# Patient Record
Sex: Female | Born: 1970
Health system: Southern US, Community
[De-identification: ages and names within clinical notes are randomized; demographics above are authoritative.]

## PROBLEM LIST (undated history)

## (undated) DIAGNOSIS — L309 Dermatitis, unspecified: Secondary | ICD-10-CM

## (undated) HISTORY — DX: Dermatitis, unspecified: L30.9

---

## 1997-10-24 ENCOUNTER — Emergency Department (HOSPITAL_COMMUNITY): Admission: EM | Admit: 1997-10-24 | Discharge: 1997-10-24 | Payer: Self-pay | Admitting: Emergency Medicine

## 1997-10-24 ENCOUNTER — Encounter: Payer: Self-pay | Admitting: Emergency Medicine

## 1998-10-20 ENCOUNTER — Inpatient Hospital Stay (HOSPITAL_COMMUNITY): Admission: AD | Admit: 1998-10-20 | Discharge: 1998-10-20 | Payer: Self-pay | Admitting: Obstetrics and Gynecology

## 1998-10-21 ENCOUNTER — Emergency Department (HOSPITAL_COMMUNITY): Admission: EM | Admit: 1998-10-21 | Discharge: 1998-10-21 | Payer: Self-pay

## 1998-12-17 ENCOUNTER — Inpatient Hospital Stay (HOSPITAL_COMMUNITY): Admission: AD | Admit: 1998-12-17 | Discharge: 1998-12-17 | Payer: Self-pay | Admitting: Obstetrics & Gynecology

## 1998-12-25 ENCOUNTER — Inpatient Hospital Stay (HOSPITAL_COMMUNITY): Admission: AD | Admit: 1998-12-25 | Discharge: 1998-12-28 | Payer: Self-pay | Admitting: Obstetrics and Gynecology

## 1999-01-25 ENCOUNTER — Other Ambulatory Visit: Admission: RE | Admit: 1999-01-25 | Discharge: 1999-01-25 | Payer: Self-pay | Admitting: Obstetrics and Gynecology

## 1999-03-14 ENCOUNTER — Emergency Department (HOSPITAL_COMMUNITY): Admission: EM | Admit: 1999-03-14 | Discharge: 1999-03-14 | Payer: Self-pay | Admitting: Internal Medicine

## 1999-11-03 ENCOUNTER — Emergency Department (HOSPITAL_COMMUNITY): Admission: EM | Admit: 1999-11-03 | Discharge: 1999-11-03 | Payer: Self-pay | Admitting: Emergency Medicine

## 1999-11-08 ENCOUNTER — Encounter: Payer: Self-pay | Admitting: General Practice

## 1999-11-08 ENCOUNTER — Encounter: Admission: RE | Admit: 1999-11-08 | Discharge: 1999-11-08 | Payer: Self-pay | Admitting: General Practice

## 2000-03-15 ENCOUNTER — Other Ambulatory Visit: Admission: RE | Admit: 2000-03-15 | Discharge: 2000-03-15 | Payer: Self-pay | Admitting: Gynecology

## 2001-08-25 ENCOUNTER — Inpatient Hospital Stay (HOSPITAL_COMMUNITY): Admission: AD | Admit: 2001-08-25 | Discharge: 2001-08-27 | Payer: Self-pay | Admitting: Obstetrics and Gynecology

## 2001-10-01 ENCOUNTER — Other Ambulatory Visit: Admission: RE | Admit: 2001-10-01 | Discharge: 2001-10-01 | Payer: Self-pay | Admitting: Obstetrics and Gynecology

## 2002-10-31 ENCOUNTER — Emergency Department (HOSPITAL_COMMUNITY): Admission: EM | Admit: 2002-10-31 | Discharge: 2002-10-31 | Payer: Self-pay | Admitting: Emergency Medicine

## 2002-11-02 ENCOUNTER — Emergency Department (HOSPITAL_COMMUNITY): Admission: EM | Admit: 2002-11-02 | Discharge: 2002-11-02 | Payer: Self-pay | Admitting: Emergency Medicine

## 2002-11-27 ENCOUNTER — Other Ambulatory Visit: Admission: RE | Admit: 2002-11-27 | Discharge: 2002-11-27 | Payer: Self-pay | Admitting: Obstetrics and Gynecology

## 2002-11-30 ENCOUNTER — Other Ambulatory Visit: Admission: RE | Admit: 2002-11-30 | Discharge: 2002-11-30 | Payer: Self-pay | Admitting: Obstetrics and Gynecology

## 2003-02-10 ENCOUNTER — Ambulatory Visit (HOSPITAL_COMMUNITY): Admission: RE | Admit: 2003-02-10 | Discharge: 2003-02-10 | Payer: Self-pay | Admitting: Obstetrics and Gynecology

## 2003-04-01 ENCOUNTER — Inpatient Hospital Stay (HOSPITAL_COMMUNITY): Admission: AD | Admit: 2003-04-01 | Discharge: 2003-04-01 | Payer: Self-pay | Admitting: Obstetrics and Gynecology

## 2003-04-03 ENCOUNTER — Inpatient Hospital Stay (HOSPITAL_COMMUNITY): Admission: AD | Admit: 2003-04-03 | Discharge: 2003-04-03 | Payer: Self-pay | Admitting: Obstetrics & Gynecology

## 2003-04-20 ENCOUNTER — Inpatient Hospital Stay (HOSPITAL_COMMUNITY): Admission: AD | Admit: 2003-04-20 | Discharge: 2003-04-20 | Payer: Self-pay | Admitting: Obstetrics & Gynecology

## 2003-07-03 ENCOUNTER — Inpatient Hospital Stay (HOSPITAL_COMMUNITY): Admission: AD | Admit: 2003-07-03 | Discharge: 2003-07-05 | Payer: Self-pay | Admitting: Obstetrics and Gynecology

## 2004-07-12 ENCOUNTER — Emergency Department (HOSPITAL_COMMUNITY): Admission: EM | Admit: 2004-07-12 | Discharge: 2004-07-12 | Payer: Self-pay | Admitting: Family Medicine

## 2004-11-10 ENCOUNTER — Emergency Department (HOSPITAL_COMMUNITY): Admission: EM | Admit: 2004-11-10 | Discharge: 2004-11-10 | Payer: Self-pay | Admitting: Family Medicine

## 2005-07-20 ENCOUNTER — Emergency Department (HOSPITAL_COMMUNITY): Admission: EM | Admit: 2005-07-20 | Discharge: 2005-07-20 | Payer: Self-pay | Admitting: Family Medicine

## 2006-02-01 ENCOUNTER — Ambulatory Visit: Payer: Self-pay | Admitting: Internal Medicine

## 2006-02-14 ENCOUNTER — Ambulatory Visit: Payer: Self-pay | Admitting: *Deleted

## 2006-03-05 ENCOUNTER — Encounter: Payer: Self-pay | Admitting: Internal Medicine

## 2006-03-05 ENCOUNTER — Ambulatory Visit: Payer: Self-pay | Admitting: Internal Medicine

## 2006-08-10 ENCOUNTER — Emergency Department (HOSPITAL_COMMUNITY): Admission: EM | Admit: 2006-08-10 | Discharge: 2006-08-10 | Payer: Self-pay | Admitting: Emergency Medicine

## 2006-10-18 ENCOUNTER — Emergency Department (HOSPITAL_COMMUNITY): Admission: EM | Admit: 2006-10-18 | Discharge: 2006-10-18 | Payer: Self-pay | Admitting: Family Medicine

## 2006-11-06 ENCOUNTER — Encounter (INDEPENDENT_AMBULATORY_CARE_PROVIDER_SITE_OTHER): Payer: Self-pay | Admitting: *Deleted

## 2006-12-19 ENCOUNTER — Ambulatory Visit: Payer: Self-pay | Admitting: Internal Medicine

## 2006-12-19 LAB — CONVERTED CEMR LAB
AST: 14 units/L (ref 0–37)
Albumin: 4.3 g/dL (ref 3.5–5.2)
Alkaline Phosphatase: 34 units/L — ABNORMAL LOW (ref 39–117)
Basophils Relative: 1 % (ref 0–1)
Eosinophils Absolute: 0.1 10*3/uL (ref 0.0–0.7)
MCHC: 32.8 g/dL (ref 30.0–36.0)
MCV: 81.8 fL (ref 78.0–100.0)
Neutrophils Relative %: 65 % (ref 43–77)
Platelets: 230 10*3/uL (ref 150–400)
Potassium: 4.1 meq/L (ref 3.5–5.3)
RDW: 14.4 % — ABNORMAL HIGH (ref 11.5–14.0)
Sodium: 139 meq/L (ref 135–145)
Total Protein: 7.7 g/dL (ref 6.0–8.3)

## 2007-03-07 ENCOUNTER — Ambulatory Visit: Payer: Self-pay | Admitting: Internal Medicine

## 2007-03-07 LAB — CONVERTED CEMR LAB

## 2007-07-08 ENCOUNTER — Ambulatory Visit: Payer: Self-pay | Admitting: Internal Medicine

## 2007-10-09 ENCOUNTER — Ambulatory Visit: Payer: Self-pay | Admitting: Internal Medicine

## 2007-12-09 ENCOUNTER — Ambulatory Visit: Payer: Self-pay | Admitting: Family Medicine

## 2008-03-10 ENCOUNTER — Emergency Department (HOSPITAL_COMMUNITY): Admission: EM | Admit: 2008-03-10 | Discharge: 2008-03-10 | Payer: Self-pay | Admitting: Emergency Medicine

## 2008-04-01 ENCOUNTER — Encounter: Payer: Self-pay | Admitting: Internal Medicine

## 2008-04-01 ENCOUNTER — Ambulatory Visit: Payer: Self-pay | Admitting: Internal Medicine

## 2008-04-21 ENCOUNTER — Emergency Department (HOSPITAL_COMMUNITY): Admission: EM | Admit: 2008-04-21 | Discharge: 2008-04-21 | Payer: Self-pay | Admitting: Emergency Medicine

## 2008-09-06 ENCOUNTER — Ambulatory Visit: Payer: Self-pay | Admitting: Family Medicine

## 2008-10-21 ENCOUNTER — Ambulatory Visit: Payer: Self-pay | Admitting: Internal Medicine

## 2009-12-01 ENCOUNTER — Encounter (INDEPENDENT_AMBULATORY_CARE_PROVIDER_SITE_OTHER): Payer: Self-pay | Admitting: *Deleted

## 2009-12-01 LAB — CONVERTED CEMR LAB
ALT: <8 units/L (ref 0–35)
Albumin: 4.1 g/dL (ref 3.5–5.2)
Basophils Absolute: 0.1 10*3/uL (ref 0.0–0.1)
CO2: 26 meq/L (ref 19–32)
Calcium: 9.2 mg/dL (ref 8.4–10.5)
Chlamydia, DNA Probe: NEGATIVE
Chloride: 102 meq/L (ref 96–112)
Creatinine, Ser: 0.75 mg/dL (ref 0.40–1.20)
GC Probe Amp, Genital: NEGATIVE
Hemoglobin: 11.9 g/dL — ABNORMAL LOW (ref 12.0–15.0)
Lymphocytes Relative: 31 % (ref 12–46)
Monocytes Absolute: 0.6 10*3/uL (ref 0.1–1.0)
Neutro Abs: 4.5 10*3/uL (ref 1.7–7.7)
Neutrophils Relative %: 55 % (ref 43–77)
Potassium: 4.4 meq/L (ref 3.5–5.3)
RDW: 14.5 % (ref 11.5–15.5)
Sodium: 138 meq/L (ref 135–145)
TSH: 0.945 microintl units/mL (ref 0.350–4.500)
Total Protein: 7.7 g/dL (ref 6.0–8.3)

## 2010-06-01 LAB — URINALYSIS, ROUTINE W REFLEX MICROSCOPIC
Bilirubin Urine: NEGATIVE
Hgb urine dipstick: NEGATIVE
Ketones, ur: NEGATIVE mg/dL
Nitrite: NEGATIVE
Protein, ur: NEGATIVE mg/dL
Urobilinogen, UA: 0.2 mg/dL (ref 0.0–1.0)

## 2010-06-05 LAB — DIFFERENTIAL
Lymphocytes Relative: 18 % (ref 12–46)
Lymphs Abs: 1.1 10*3/uL (ref 0.7–4.0)
Monocytes Absolute: 0.4 10*3/uL (ref 0.1–1.0)
Monocytes Relative: 6 % (ref 3–12)
Neutro Abs: 4.7 10*3/uL (ref 1.7–7.7)
Neutrophils Relative %: 74 % (ref 43–77)

## 2010-06-05 LAB — CBC
HCT: 38.4 % (ref 36.0–46.0)
Hemoglobin: 12.6 g/dL (ref 12.0–15.0)
MCHC: 32.9 g/dL (ref 30.0–36.0)
Platelets: 188 10*3/uL (ref 150–400)
RDW: 13.7 % (ref 11.5–15.5)

## 2010-06-05 LAB — COMPREHENSIVE METABOLIC PANEL
Albumin: 3.8 g/dL (ref 3.5–5.2)
Alkaline Phosphatase: 37 U/L — ABNORMAL LOW (ref 39–117)
BUN: 5 mg/dL — ABNORMAL LOW (ref 6–23)
Calcium: 9.3 mg/dL (ref 8.4–10.5)
Glucose, Bld: 99 mg/dL (ref 70–99)
Potassium: 4.2 mEq/L (ref 3.5–5.1)
Sodium: 140 mEq/L (ref 135–145)
Total Protein: 7.3 g/dL (ref 6.0–8.3)

## 2010-06-05 LAB — URINALYSIS, ROUTINE W REFLEX MICROSCOPIC
Bilirubin Urine: NEGATIVE
Glucose, UA: NEGATIVE mg/dL
Protein, ur: NEGATIVE mg/dL
Urobilinogen, UA: 0.2 mg/dL (ref 0.0–1.0)

## 2010-06-05 LAB — URINE CULTURE: Colony Count: NO GROWTH

## 2010-06-05 LAB — BRAIN NATRIURETIC PEPTIDE: Pro B Natriuretic peptide (BNP): <30 pg/mL (ref 0.0–100.0)

## 2010-07-04 ENCOUNTER — Emergency Department (HOSPITAL_COMMUNITY)
Admission: EM | Admit: 2010-07-04 | Discharge: 2010-07-04 | Disposition: A | Discharge status: Home / Self Care | Payer: No Typology Code available for payment source | Attending: Emergency Medicine | Admitting: Emergency Medicine

## 2010-07-04 DIAGNOSIS — T148XXA Other injury of unspecified body region, initial encounter: Secondary | ICD-10-CM | POA: Insufficient documentation

## 2010-07-04 DIAGNOSIS — Y92410 Unspecified street and highway as the place of occurrence of the external cause: Secondary | ICD-10-CM | POA: Insufficient documentation

## 2010-07-04 DIAGNOSIS — Z79899 Other long term (current) drug therapy: Secondary | ICD-10-CM | POA: Insufficient documentation

## 2010-07-04 DIAGNOSIS — M542 Cervicalgia: Secondary | ICD-10-CM | POA: Insufficient documentation

## 2010-07-04 DIAGNOSIS — Y998 Other external cause status: Secondary | ICD-10-CM | POA: Insufficient documentation

## 2010-07-04 DIAGNOSIS — M25519 Pain in unspecified shoulder: Secondary | ICD-10-CM | POA: Insufficient documentation

## 2010-07-04 DIAGNOSIS — R079 Chest pain, unspecified: Secondary | ICD-10-CM | POA: Insufficient documentation

## 2010-11-30 ENCOUNTER — Other Ambulatory Visit: Payer: Self-pay | Admitting: Family Medicine

## 2010-11-30 DIAGNOSIS — M545 Low back pain, unspecified: Secondary | ICD-10-CM

## 2010-12-05 ENCOUNTER — Ambulatory Visit
Admission: RE | Admit: 2010-12-05 | Discharge: 2010-12-05 | Disposition: A | Discharge status: Home / Self Care | Payer: No Typology Code available for payment source | Source: Ambulatory Visit | Attending: Family Medicine | Admitting: Family Medicine

## 2010-12-05 DIAGNOSIS — M545 Low back pain, unspecified: Secondary | ICD-10-CM

## 2010-12-06 LAB — POCT RAPID STREP A: Streptococcus, Group A Screen (Direct): NEGATIVE

## 2010-12-14 ENCOUNTER — Ambulatory Visit
Admission: RE | Admit: 2010-12-14 | Discharge: 2010-12-14 | Disposition: A | Discharge status: Home / Self Care | Payer: No Typology Code available for payment source | Source: Ambulatory Visit | Attending: Family Medicine | Admitting: Family Medicine

## 2010-12-28 ENCOUNTER — Ambulatory Visit: Payer: No Typology Code available for payment source | Attending: Family Medicine | Admitting: Physical Therapy

## 2010-12-28 DIAGNOSIS — M545 Low back pain, unspecified: Secondary | ICD-10-CM | POA: Insufficient documentation

## 2010-12-28 DIAGNOSIS — IMO0001 Reserved for inherently not codable concepts without codable children: Secondary | ICD-10-CM | POA: Insufficient documentation

## 2011-01-09 ENCOUNTER — Ambulatory Visit: Payer: No Typology Code available for payment source | Admitting: Physical Therapy

## 2011-01-15 ENCOUNTER — Ambulatory Visit: Payer: No Typology Code available for payment source | Admitting: Rehabilitation

## 2011-01-17 ENCOUNTER — Ambulatory Visit: Payer: No Typology Code available for payment source | Admitting: Rehabilitation

## 2011-01-22 ENCOUNTER — Ambulatory Visit: Payer: No Typology Code available for payment source | Attending: Family Medicine | Admitting: Rehabilitation

## 2011-01-22 DIAGNOSIS — IMO0001 Reserved for inherently not codable concepts without codable children: Secondary | ICD-10-CM | POA: Insufficient documentation

## 2011-01-22 DIAGNOSIS — M545 Low back pain, unspecified: Secondary | ICD-10-CM | POA: Insufficient documentation

## 2011-01-24 ENCOUNTER — Ambulatory Visit: Payer: No Typology Code available for payment source | Admitting: Rehabilitation

## 2011-01-31 ENCOUNTER — Encounter: Payer: No Typology Code available for payment source | Admitting: Physical Therapy

## 2011-02-02 ENCOUNTER — Encounter: Payer: No Typology Code available for payment source | Admitting: Physical Therapy

## 2011-09-03 ENCOUNTER — Encounter: Payer: Self-pay | Admitting: Physical Medicine & Rehabilitation

## 2011-10-16 ENCOUNTER — Ambulatory Visit: Payer: Self-pay | Admitting: Physical Medicine & Rehabilitation

## 2011-10-16 ENCOUNTER — Encounter: Payer: Self-pay | Attending: Physical Medicine & Rehabilitation

## 2014-04-13 ENCOUNTER — Emergency Department (HOSPITAL_COMMUNITY)
Admission: EM | Admit: 2014-04-13 | Discharge: 2014-04-13 | Disposition: A | Discharge status: Home / Self Care | Payer: BC Managed Care – PPO | Attending: Emergency Medicine | Admitting: Emergency Medicine

## 2014-04-13 ENCOUNTER — Encounter (HOSPITAL_COMMUNITY): Payer: Self-pay

## 2014-04-13 ENCOUNTER — Emergency Department (HOSPITAL_COMMUNITY): Payer: BC Managed Care – PPO

## 2014-04-13 DIAGNOSIS — S6992XA Unspecified injury of left wrist, hand and finger(s), initial encounter: Secondary | ICD-10-CM | POA: Diagnosis not present

## 2014-04-13 DIAGNOSIS — M25512 Pain in left shoulder: Secondary | ICD-10-CM

## 2014-04-13 DIAGNOSIS — Y9301 Activity, walking, marching and hiking: Secondary | ICD-10-CM | POA: Insufficient documentation

## 2014-04-13 DIAGNOSIS — S199XXA Unspecified injury of neck, initial encounter: Secondary | ICD-10-CM | POA: Insufficient documentation

## 2014-04-13 DIAGNOSIS — R202 Paresthesia of skin: Secondary | ICD-10-CM

## 2014-04-13 DIAGNOSIS — S4992XA Unspecified injury of left shoulder and upper arm, initial encounter: Secondary | ICD-10-CM | POA: Insufficient documentation

## 2014-04-13 DIAGNOSIS — Y92481 Parking lot as the place of occurrence of the external cause: Secondary | ICD-10-CM | POA: Diagnosis not present

## 2014-04-13 DIAGNOSIS — M542 Cervicalgia: Secondary | ICD-10-CM

## 2014-04-13 DIAGNOSIS — M25532 Pain in left wrist: Secondary | ICD-10-CM

## 2014-04-13 DIAGNOSIS — Y998 Other external cause status: Secondary | ICD-10-CM | POA: Insufficient documentation

## 2014-04-13 MED ORDER — NAPROXEN 500 MG PO TABS: 500.0000 mg | ORAL_TABLET | Freq: Two times a day (BID) | ORAL | Status: DC
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
## 2014-04-13 NOTE — Discharge Instructions (Signed)
Paresthesia Paresthesia is an abnormal burning or prickling sensation. This sensation is generally felt in the hands, arms, legs, or feet. However, it may occur in any part of the body. It is usually not painful. The feeling may be described as:  Tingling or numbness.  "Pins and needles."  Skin crawling.  Buzzing.  Limbs "falling asleep."  Itching. Most people experience temporary (transient) paresthesia at some time in their lives. CAUSES  Paresthesia may occur when you breathe too quickly (hyperventilation). It can also occur without any apparent cause. Commonly, paresthesia occurs when pressure is placed on a nerve. The feeling quickly goes away once the pressure is removed. For some people, however, paresthesia is a long-lasting (chronic) condition caused by an underlying disorder. The underlying disorder may be:  A traumatic, direct injury to nerves. Examples include a:  Broken (fractured) neck.  Fractured skull.  A disorder affecting the brain and spinal cord (central nervous system). Examples include:  Transverse myelitis.  Encephalitis.  Transient ischemic attack.  Multiple sclerosis.  Stroke.  Tumor or blood vessel problems, such as an arteriovenous malformation pressing against the brain or spinal cord.  A condition that damages the peripheral nerves (peripheral neuropathy). Peripheral nerves are not part of the brain and spinal cord. These conditions include:  Diabetes.  Peripheral vascular disease.  Nerve entrapment syndromes, such as carpal tunnel syndrome.  Shingles.  Hypothyroidism.  Vitamin B12 deficiencies.  Alcoholism.  Heavy metal poisoning (lead, arsenic).  Rheumatoid arthritis.  Systemic lupus erythematosus. DIAGNOSIS  Your caregiver will attempt to find the underlying cause of your paresthesia. Your caregiver may:  Take your medical history.  Perform a physical exam.  Order various lab tests.  Order imaging tests. TREATMENT    Treatment for paresthesia depends on the underlying cause. HOME CARE INSTRUCTIONS  Avoid drinking alcohol.  You may consider massage or acupuncture to help relieve your symptoms.  Keep all follow-up appointments as directed by your caregiver. SEEK IMMEDIATE MEDICAL CARE IF:   You feel weak.  You have trouble walking or moving.  You have problems with speech or vision.  You feel confused.  You cannot control your bladder or bowel movements.  You feel numbness after an injury.  You faint.  Your burning or prickling feeling gets worse when walking.  You have pain, cramps, or dizziness.  You develop a rash. MAKE SURE YOU:  Understand these instructions.  Will watch your condition.  Will get help right away if you are not doing well or get worse. Document Released: 01/26/2002 Document Revised: 04/30/2011 Document Reviewed: 10/27/2010 [facility] Patient Information 2015 [place], [place]. This information is not intended to replace advice given to you by your health care provider. Make sure you discuss any questions you have with your health care provider.  Shoulder Pain The shoulder is the joint that connects your arms to your body. The bones that form the shoulder joint include the upper arm bone (humerus), the shoulder blade (scapula), and the collarbone (clavicle). The top of the humerus is shaped like a ball and fits into a rather flat socket on the scapula (glenoid cavity). A combination of muscles and strong, fibrous tissues that connect muscles to bones (tendons) support your shoulder joint and hold the ball in the socket. Small, fluid-filled sacs (bursae) are located in different areas of the joint. They act as cushions between the bones and the overlying soft tissues and help reduce friction between the gliding tendons and the bone as you move your arm. Your shoulder  joint allows a wide range of motion in your arm. This range of motion allows you to do things like scratch  your back or throw a ball. However, this range of motion also makes your shoulder more prone to pain from overuse and injury. Causes of shoulder pain can originate from both injury and overuse and usually can be grouped in the following four categories:  Redness, swelling, and pain (inflammation) of the tendon (tendinitis) or the bursae (bursitis).  Instability, such as a dislocation of the joint.  Inflammation of the joint (arthritis).  Broken bone (fracture). HOME CARE INSTRUCTIONS   Apply ice to the sore area.  Put ice in a plastic bag.  Place a towel between your skin and the bag.  Leave the ice on for 15-20 minutes, 3-4 times per day for the first 2 days, or as directed by your health care provider.  Stop using cold packs if they do not help with the pain.  If you have a shoulder sling or immobilizer, wear it as long as your caregiver instructs. Only remove it to shower or bathe. Move your arm as little as possible, but keep your hand moving to prevent swelling.  Squeeze a soft ball or foam pad as much as possible to help prevent swelling.  Only take over-the-counter or prescription medicines for pain, discomfort, or fever as directed by your caregiver. SEEK MEDICAL CARE IF:   Your shoulder pain increases, or new pain develops in your arm, hand, or fingers.  Your hand or fingers become cold and numb.  Your pain is not relieved with medicines. SEEK IMMEDIATE MEDICAL CARE IF:   Your arm, hand, or fingers are numb or tingling.  Your arm, hand, or fingers are significantly swollen or turn white or blue. MAKE SURE YOU:   Understand these instructions.  Will watch your condition.  Will get help right away if you are not doing well or get worse. Document Released: 11/15/2004 Document Revised: 06/22/2013 Document Reviewed: 01/20/2011 [facility] Patient Information 2015 [place], [place]. This information is not intended to replace advice given to you by your health care  provider. Make sure you discuss any questions you have with your health care provider.   Wrist Pain Wrist injuries are frequent in adults and children. A sprain is an injury to the ligaments that hold your bones together. A strain is an injury to muscle or muscle cord-like structures (tendons) from stretching or pulling. Generally, when wrists are moderately tender to touch following a fall or injury, a break in the bone (fracture) may be present. Most wrist sprains or strains are better in 3 to 5 days, but complete healing may take several weeks. HOME CARE INSTRUCTIONS   Put ice on the injured area.  Put ice in a plastic bag.  Place a towel between your skin and the bag.  Leave the ice on for 15-20 minutes, 3-4 times a day, for the first 2 days, or as directed by your health care provider.  Keep your arm raised above the level of your heart whenever possible to reduce swelling and pain.  Rest the injured area for at least 48 hours or as directed by your health care provider.  If a splint or elastic bandage has been applied, use it for as long as directed by your health care provider or until seen by a health care provider for a follow-up exam.  Only take over-the-counter or prescription medicines for pain, discomfort, or fever as directed by your health care  provider.  Keep all follow-up appointments. You may need to follow up with a specialist or have follow-up X-rays. Improvement in pain level is not a guarantee that you did not fracture a bone in your wrist. The only way to determine whether or not you have a broken bone is by X-ray. SEEK IMMEDIATE MEDICAL CARE IF:   Your fingers are swollen, very red, white, or cold and blue.  Your fingers are numb or tingling.  You have increasing pain.  You have difficulty moving your fingers. MAKE SURE YOU:   Understand these instructions.  Will watch your condition.  Will get help right away if you are not doing well or get  worse. Document Released: 11/15/2004 Document Revised: 02/10/2013 Document Reviewed: 03/29/2010 [facility] Patient Information 2015 [place], [place]. This information is not intended to replace advice given to you by your health care provider. Make sure you discuss any questions you have with your health care provider.

## 2014-04-13 NOTE — ED Notes (Signed)
Patient states she woke this AM with left shoulder pain. Patient states pain is radiating down to her left wrist and up into her left neck.

## 2014-06-19 ENCOUNTER — Emergency Department (HOSPITAL_COMMUNITY)
Admission: EM | Admit: 2014-06-19 | Discharge: 2014-06-19 | Disposition: A | Discharge status: Home / Self Care | Payer: BC Managed Care – PPO | Source: Home / Self Care | Attending: Family Medicine | Admitting: Family Medicine

## 2014-06-19 ENCOUNTER — Encounter (HOSPITAL_COMMUNITY): Payer: Self-pay | Admitting: Emergency Medicine

## 2014-06-19 DIAGNOSIS — S91331A Puncture wound without foreign body, right foot, initial encounter: Secondary | ICD-10-CM

## 2014-06-19 DIAGNOSIS — L03115 Cellulitis of right lower limb: Secondary | ICD-10-CM

## 2014-06-19 DIAGNOSIS — Z23 Encounter for immunization: Secondary | ICD-10-CM

## 2014-06-19 MED ORDER — INDOMETHACIN 25 MG PO CAPS: 25.0000 mg | ORAL_CAPSULE | Freq: Three times a day (TID) | ORAL | Status: DC | PRN

## 2014-06-19 MED ORDER — TETANUS-DIPHTH-ACELL PERTUSSIS 5-2.5-18.5 LF-MCG/0.5 IM SUSP
0.5000 mL | Freq: Once | INTRAMUSCULAR | Status: AC
  Administered 2014-06-19: 0.5 mL via INTRAMUSCULAR

## 2014-06-19 MED ORDER — CEPHALEXIN 500 MG PO CAPS: 500.0000 mg | ORAL_CAPSULE | Freq: Four times a day (QID) | ORAL | Status: DC

## 2014-06-19 MED ORDER — TETANUS-DIPHTH-ACELL PERTUSSIS 5-2.5-18.5 LF-MCG/0.5 IM SUSP
INTRAMUSCULAR | Status: AC
  Filled 2014-06-19: qty 0.5

## 2014-06-19 NOTE — ED Notes (Addendum)
C/o right foot pain onset 1400 today when she woke up after a nap Sx include swelling, pain, and localized fever Denies inj/trauma Pain increases w/activity Alert, no signs of acute distress.

## 2014-06-19 NOTE — Discharge Instructions (Signed)
Elevate. Warm compresses. Medications as directed. Follow up with your primary care doctor in 2 days for recheck.  Cellulitis Cellulitis is an infection of the skin and the tissue beneath it. The infected area is usually red and tender. Cellulitis occurs most often in the arms and lower legs.  CAUSES  Cellulitis is caused by bacteria that enter the skin through cracks or cuts in the skin. The most common types of bacteria that cause cellulitis are staphylococci and streptococci. SIGNS AND SYMPTOMS   Redness and warmth.  Swelling.  Tenderness or pain.  Fever. DIAGNOSIS  Your health care provider can usually determine what is wrong based on a physical exam. Blood tests may also be done. TREATMENT  Treatment usually involves taking an antibiotic medicine. HOME CARE INSTRUCTIONS   Take your antibiotic medicine as directed by your health care provider. Finish the antibiotic even if you start to feel better.  Keep the infected arm or leg elevated to reduce swelling.  Apply a warm cloth to the affected area up to 4 times per day to relieve pain.  Take medicines only as directed by your health care provider.  Keep all follow-up visits as directed by your health care provider. SEEK MEDICAL CARE IF:   You notice red streaks coming from the infected area.  Your red area gets larger or turns dark in color.  Your bone or joint underneath the infected area becomes painful after the skin has healed.  Your infection returns in the same area or another area.  You notice a swollen bump in the infected area.  You develop new symptoms.  You have a fever. SEEK IMMEDIATE MEDICAL CARE IF:   You feel very sleepy.  You develop vomiting or diarrhea.  You have a general ill feeling (malaise) with muscle aches and pains. MAKE SURE YOU:   Understand these instructions.  Will watch your condition.  Will get help right away if you are not doing well or get worse. Document Released:  11/15/2004 Document Revised: 06/22/2013 Document Reviewed: 04/23/2011 [facility] Patient Information 2015 [place], [place]. This information is not intended to replace advice given to you by your health care provider. Make sure you discuss any questions you have with your health care provider.  Puncture Wound A puncture wound is an injury that extends through all layers of the skin and into the tissue beneath the skin (subcutaneous tissue). Puncture wounds become infected easily because germs often enter the body and go beneath the skin during the injury. Having a deep wound with a small entrance point makes it difficult for your caregiver to adequately clean the wound. This is especially true if you have stepped on a nail and it has passed through a dirty shoe or other situations where the wound is obviously contaminated. CAUSES  Many puncture wounds involve glass, nails, splinters, fish hooks, or other objects that enter the skin (foreign bodies). A puncture wound may also be caused by a human bite or animal bite. DIAGNOSIS  A puncture wound is usually diagnosed by your history and a physical exam. You may need to have an X-ray or an ultrasound to check for any foreign bodies still in the wound. TREATMENT   Your caregiver will clean the wound as thoroughly as possible. Depending on the location of the wound, a bandage (dressing) may be applied.  Your caregiver might prescribe antibiotic medicines.  You may need a follow-up visit to check on your wound. Follow all instructions as directed by your caregiver. HOME  CARE INSTRUCTIONS   Change your dressing once per day, or as directed by your caregiver. If the dressing sticks, it may be removed by soaking the area in water.  If your caregiver has given you follow-up instructions, it is very important that you return for a follow-up appointment. Not following up as directed could result in a chronic or permanent injury, pain, and disability.  Only  take over-the-counter or prescription medicines for pain, discomfort, or fever as directed by your caregiver.  If you are given antibiotics, take them as directed. Finish them even if you start to feel better. You may need a tetanus shot if:  You cannot remember when you had your last tetanus shot.  You have never had a tetanus shot. If you got a tetanus shot, your arm may swell, get red, and feel warm to the touch. This is common and not a problem. If you need a tetanus shot and you choose not to have one, there is a rare chance of getting tetanus. Sickness from tetanus can be serious. You may need a rabies shot if an animal bite caused your puncture wound. SEEK MEDICAL CARE IF:   You have redness, swelling, or increasing pain in the wound.  You have red streaks going away from the wound.  You notice a bad smell coming from the wound or dressing.  You have yellowish-white fluid (pus) coming from the wound.  You are treated with an antibiotic for infection, but the infection is not getting better.  You notice something in the wound, such as rubber from your shoe, cloth, or another object.  You have a fever.  You have severe pain.  You have difficulty breathing.  You feel dizzy or faint.  You cannot stop vomiting.  You lose feeling, develop numbness, or cannot move a limb below the wound.  Your symptoms worsen. MAKE SURE YOU:  Understand these instructions.  Will watch your condition.  Will get help right away if you are not doing well or get worse. Document Released: 11/15/2004 Document Revised: 04/30/2011 Document Reviewed: 07/25/2010 [facility] Patient Information 2015 [place], [place]. This information is not intended to replace advice given to you by your health care provider. Make sure you discuss any questions you have with your health care provider.

## 2014-06-19 NOTE — ED Provider Notes (Signed)
CSN: [id number]     Arrival date & time 06/19/14  1758 History   First MD Initiated Contact with Patient 06/19/14 1907     Chief Complaint  Patient presents with  . Foot Pain   (Consider location/radiation/quality/duration/timing/severity/associated sxs/prior Treatment) HPI Comments: Patient states she took a nap this afternoon and then woke around 2pm with a very painful right foot in the region of her right great toe. Does not recall recent injury. No previous episodes. No hx of gout. Reports herself to be otherwise healthy. Non-smoker. No ETOH. Works as Engineer, siteschool teacher.  The history is provided by the patient.    History reviewed. No pertinent past medical history. Past Surgical History  Procedure Laterality Date  . Cesarean section     Family History  Problem Relation Age of Onset  . Cancer Mother   . Cancer Father   . Hypertension Sister   . Hypertension Brother    History  Substance Use Topics  . Smoking status: Never Smoker   . Smokeless tobacco: Not on file  . Alcohol Use: No   OB History    No data available     Review of Systems  All other systems reviewed and are negative.   Allergies  Review of patient's allergies indicates no known allergies.  Home Medications   Prior to Admission medications   Medication Sig Start Date End Date Taking? Authorizing Provider  cephALEXin (KEFLEX) 500 MG capsule Take 1 capsule (500 mg total) by mouth 4 (four) times daily. X 7 days 06/19/14   Ria ClockJennifer Lee H Lachlan Pelto, PA  indomethacin (INDOCIN) 25 MG capsule Take 1 capsule (25 mg total) by mouth 3 (three) times daily as needed for mild pain or moderate pain. 06/19/14   Mathis FareJennifer Lee H Damaya Channing, PA  naproxen (NAPROSYN) 500 MG tablet Take 1 tablet (500 mg total) by mouth 2 (two) times daily. 04/13/14   Abigail Harris, PA-C   BP 128/70 mmHg  Pulse 63  Temp(Src) 97.4 F (36.3 C) (Oral)  Resp 16  SpO2 100% Physical Exam  Constitutional: She is oriented to person, place, and time.  She appears well-developed and well-nourished.  HENT:  Head: Normocephalic and atraumatic.  Eyes: Conjunctivae are normal.  Cardiovascular: Normal rate.   Pulmonary/Chest: Effort normal.  Musculoskeletal: Normal range of motion.       Feet:  Outlined area is region of moderate pain with palpation and ROM. Mild STS and erythema without induration or fluctuance. When plantar surface of foot examined, I identified a small puncture wound at base of right great toe. When I questioned patient regarding this, she then recalled having stepped on a toothpick that was embedded in the carpet at her home while wearing socks one day prior to DOS. No palpable or visible FB.   Neurological: She is alert and oriented to person, place, and time.  Skin: Skin is warm and dry.  Psychiatric: She has a normal mood and affect. Her behavior is normal.  Nursing note and vitals reviewed.   ED Course  Procedures (including critical care time) Labs Review Labs Reviewed - No data to display  Imaging Review No results found.   MDM   1. Puncture wound of right foot, initial encounter   2. Cellulitis of right foot    Patient received Tdap booster on DOS given new puncture wound and tetanus status unknown.  Will treat as cellulitis from recent puncture wound. Cephalexin, Indocin, warm compresses and elevation. Strongly encouraged to follow up with her PCP  in 48 hours for re-check.    Ria Clock, [place] 06/20/14 [phone]

## 2014-08-27 ENCOUNTER — Encounter (HOSPITAL_COMMUNITY): Payer: Self-pay | Admitting: *Deleted

## 2014-08-27 ENCOUNTER — Emergency Department (HOSPITAL_COMMUNITY)
Admission: EM | Admit: 2014-08-27 | Discharge: 2014-08-27 | Disposition: A | Discharge status: Home / Self Care | Payer: BC Managed Care – PPO | Attending: Emergency Medicine | Admitting: Emergency Medicine

## 2014-08-27 DIAGNOSIS — Y998 Other external cause status: Secondary | ICD-10-CM | POA: Insufficient documentation

## 2014-08-27 DIAGNOSIS — Y9389 Activity, other specified: Secondary | ICD-10-CM | POA: Insufficient documentation

## 2014-08-27 DIAGNOSIS — T7840XA Allergy, unspecified, initial encounter: Secondary | ICD-10-CM | POA: Diagnosis present

## 2014-08-27 DIAGNOSIS — Y9289 Other specified places as the place of occurrence of the external cause: Secondary | ICD-10-CM | POA: Diagnosis not present

## 2014-08-27 DIAGNOSIS — Z791 Long term (current) use of non-steroidal anti-inflammatories (NSAID): Secondary | ICD-10-CM | POA: Diagnosis not present

## 2014-08-27 DIAGNOSIS — R1310 Dysphagia, unspecified: Secondary | ICD-10-CM | POA: Diagnosis not present

## 2014-08-27 DIAGNOSIS — X58XXXA Exposure to other specified factors, initial encounter: Secondary | ICD-10-CM | POA: Insufficient documentation

## 2014-08-27 DIAGNOSIS — T781XXA Other adverse food reactions, not elsewhere classified, initial encounter: Secondary | ICD-10-CM | POA: Insufficient documentation

## 2014-08-27 MED ORDER — DIPHENHYDRAMINE HCL 50 MG/ML IJ SOLN
25.0000 mg | Freq: Once | INTRAMUSCULAR | Status: AC
  Administered 2014-08-27: 25 mg via INTRAVENOUS
  Filled 2014-08-27: qty 1

## 2014-08-27 MED ORDER — PREDNISONE 20 MG PO TABS: 40.0000 mg | ORAL_TABLET | Freq: Every day | ORAL | Status: DC

## 2014-08-27 MED ORDER — FAMOTIDINE 20 MG PO TABS
20.0000 mg | ORAL_TABLET | Freq: Once | ORAL | Status: AC
  Administered 2014-08-27: 20 mg via ORAL
  Filled 2014-08-27: qty 1

## 2014-08-27 MED ORDER — METHYLPREDNISOLONE SODIUM SUCC 125 MG IJ SOLR
125.0000 mg | Freq: Once | INTRAMUSCULAR | Status: AC
  Administered 2014-08-27: 125 mg via INTRAVENOUS
  Filled 2014-08-27: qty 2

## 2014-08-27 MED ORDER — EPINEPHRINE 0.3 MG/0.3ML IJ SOAJ: 0.3000 mg | Freq: Once | INTRAMUSCULAR | Status: DC

## 2014-08-27 NOTE — Discharge Instructions (Signed)

## 2014-08-27 NOTE — ED Provider Notes (Addendum)
CSN: [id number]     Arrival date & time 08/27/14  1250 History   First MD Initiated Contact with Patient 08/27/14 1257     Chief Complaint  Patient presents with  . Allergic Reaction     (Consider location/radiation/quality/duration/timing/severity/associated sxs/prior Treatment) Patient is a 44 y.o. female presenting with allergic reaction. The history is provided by the patient.  Allergic Reaction Presenting symptoms: difficulty swallowing   Presenting symptoms: no difficulty breathing   Severity:  Moderate Prior allergic episodes:  No prior episodes Context comment:  After eating cherries today pt felt like her throat was closing up.  this started around 9:30 and has gradually worsened Relieved by:  None tried Exacerbated by: lying down. Ineffective treatments:  None tried   History reviewed. No pertinent past medical history. Past Surgical History  Procedure Laterality Date  . Cesarean section     Family History  Problem Relation Age of Onset  . Cancer Mother   . Cancer Father   . Hypertension Sister   . Hypertension Brother    History  Substance Use Topics  . Smoking status: Never Smoker   . Smokeless tobacco: Not on file  . Alcohol Use: No   OB History    No data available     Review of Systems  HENT: Positive for trouble swallowing.   All other systems reviewed and are negative.     Allergies  Review of patient's allergies indicates no known allergies.  Home Medications   Prior to Admission medications   Medication Sig Start Date End Date Taking? Authorizing Provider  cephALEXin (KEFLEX) 500 MG capsule Take 1 capsule (500 mg total) by mouth 4 (four) times daily. X 7 days 06/19/14   Ria Clock, PA  indomethacin (INDOCIN) 25 MG capsule Take 1 capsule (25 mg total) by mouth 3 (three) times daily as needed for mild pain or moderate pain. 06/19/14   Mathis Fare Presson, PA  naproxen (NAPROSYN) 500 MG tablet Take 1 tablet (500 mg total) by  mouth 2 (two) times daily. 04/13/14   Abigail Harris, PA-C   BP 107/68 mmHg  Pulse 63  Temp(Src) 98.3 F (36.8 C) (Oral)  Resp 18  Ht  (1.702 m)  Wt 258 lb (117.028 kg)  BMI 40.40 kg/m2  SpO2 97%  LMP 08/13/2014 Physical Exam  Constitutional: She is oriented to person, place, and time. She appears well-developed and well-nourished. No distress.  HENT:  Head: Normocephalic and atraumatic.  Mouth/Throat: Oropharynx is clear and moist.  No pharyngeal, tongue or uvular edema  Eyes: Conjunctivae and EOM are normal. Pupils are equal, round, and reactive to light.  Neck: Normal range of motion. Neck supple.  Cardiovascular: Normal rate, regular rhythm and intact distal pulses.   No murmur heard. Pulmonary/Chest: Effort normal and breath sounds normal. No respiratory distress. She has no wheezes. She has no rales.  Musculoskeletal: Normal range of motion. She exhibits no edema or tenderness.  Neurological: She is alert and oriented to person, place, and time.  Skin: Skin is warm and dry. No rash noted. No erythema.  Psychiatric: She has a normal mood and affect. Her behavior is normal.  Nursing note and vitals reviewed.   ED Course  Procedures (including critical care time) Labs Review Labs Reviewed - No data to display  Imaging Review No results found.   EKG Interpretation None      MDM   Final diagnoses:  Allergic reaction, initial encounter    Patient presenting  today with symptoms concerning for allergic reaction after eating cherries this morning. She feels like her throat is tightening and closing. It is worsened in the last 2 hours. She is not taking anything since the symptoms started. She denies any rash or itching. No wheezing or evidence of uvular or pharyngeal edema on exam. Tongue is normal.  Patient given Benadryl, steroids and Pepcid. Will recheck. She at this point is in no acute distress and does not need epi.  2:17 PM Pt's symptoms are  improving.   Gwyneth SproutWhitney Shaniyah Wix, MD 08/27/14 1417  Gwyneth SproutWhitney Noach Calvillo, MD 08/27/14 [phone]

## 2014-08-27 NOTE — ED Notes (Signed)
Dr. Plunkett at bedside.  

## 2014-08-27 NOTE — ED Notes (Signed)
Pt reports eating a cherry this am and then onset of feeling like throat is closing at since 0930. Airway intact at this time, no acute distress noted.

## 2014-08-27 NOTE — ED Notes (Signed)
Patient tolerating PO fluids at bedside.  Family at bedside.

## 2015-09-04 ENCOUNTER — Emergency Department (HOSPITAL_COMMUNITY)
Admission: EM | Admit: 2015-09-04 | Discharge: 2015-09-04 | Disposition: A | Discharge status: Home / Self Care | Payer: PRIVATE HEALTH INSURANCE | Attending: Emergency Medicine | Admitting: Emergency Medicine

## 2015-09-04 ENCOUNTER — Ambulatory Visit (HOSPITAL_COMMUNITY)
Admission: EM | Admit: 2015-09-04 | Discharge: 2015-09-04 | Discharge status: Absent without leave | Payer: BC Managed Care – PPO

## 2015-09-04 ENCOUNTER — Encounter (HOSPITAL_COMMUNITY): Payer: Self-pay

## 2015-09-04 DIAGNOSIS — R21 Rash and other nonspecific skin eruption: Secondary | ICD-10-CM | POA: Insufficient documentation

## 2015-09-04 DIAGNOSIS — Z7952 Long term (current) use of systemic steroids: Secondary | ICD-10-CM | POA: Insufficient documentation

## 2015-09-04 DIAGNOSIS — Z791 Long term (current) use of non-steroidal anti-inflammatories (NSAID): Secondary | ICD-10-CM | POA: Diagnosis not present

## 2015-09-04 DIAGNOSIS — Z79899 Other long term (current) drug therapy: Secondary | ICD-10-CM | POA: Diagnosis not present

## 2015-09-04 MED ORDER — DIPHENHYDRAMINE HCL 25 MG PO CAPS
25.0000 mg | ORAL_CAPSULE | Freq: Once | ORAL | Status: AC
  Administered 2015-09-04: 25 mg via ORAL
  Filled 2015-09-04: qty 1

## 2015-09-04 MED ORDER — IBUPROFEN 800 MG PO TABS
800.0000 mg | ORAL_TABLET | Freq: Once | ORAL | Status: AC
  Administered 2015-09-04: 800 mg via ORAL
  Filled 2015-09-04: qty 1

## 2015-09-04 MED ORDER — CEPHALEXIN 500 MG PO CAPS
500.0000 mg | ORAL_CAPSULE | Freq: Three times a day (TID) | ORAL | Status: DC
Start: 2015-09-04 — End: 2019-12-30

## 2015-09-04 MED ORDER — DOXYCYCLINE HYCLATE 100 MG PO CAPS: 100.0000 mg | ORAL_CAPSULE | Freq: Two times a day (BID) | ORAL | Status: DC

## 2015-09-04 NOTE — ED Notes (Signed)
Verbalized understanding discharge instructions. In no acute distress.   

## 2015-09-04 NOTE — ED Provider Notes (Signed)
CSN: [id number]     Arrival date & time 09/04/15  1622 History   First MD Initiated Contact with Patient 09/04/15 1633     No chief complaint on file.     HPI  Patient was initially relation of an itching area on her left arm. She states that she thinks she may been bitten by something that she had some itching yesterday. She put some rubbing alcohol on it. Today it has progressed to approximate size of her palm. This is on her left forearm and is itching. She was concerned that it may be infected.  No past medical history on file. Past Surgical History  Procedure Laterality Date  . Cesarean section     Family History  Problem Relation Age of Onset  . Cancer Mother   . Cancer Father   . Hypertension Sister   . Hypertension Brother    Social History  Substance Use Topics  . Smoking status: Never Smoker   . Smokeless tobacco: Not on file  . Alcohol Use: No   OB History    No data available     Review of Systems  Constitutional: Negative for fever, chills, diaphoresis, appetite change and fatigue.  HENT: Negative for mouth sores, sore throat and trouble swallowing.   Eyes: Negative for visual disturbance.  Respiratory: Negative for cough, chest tightness, shortness of breath and wheezing.   Cardiovascular: Negative for chest pain.  Gastrointestinal: Negative for nausea, vomiting, abdominal pain, diarrhea and abdominal distention.  Endocrine: Negative for polydipsia, polyphagia and polyuria.  Genitourinary: Negative for dysuria, frequency and hematuria.  Musculoskeletal: Negative for gait problem.  Skin: Positive for rash. Negative for color change and pallor.  Neurological: Negative for dizziness, syncope, light-headedness and headaches.  Hematological: Does not bruise/bleed easily.  Psychiatric/Behavioral: Negative for behavioral problems and confusion.      Allergies  Review of patient's allergies indicates no known allergies.  Home Medications   Prior to  Admission medications   Medication Sig Start Date End Date Taking? Authorizing Provider  cephALEXin (KEFLEX) 500 MG capsule Take 1 capsule (500 mg total) by mouth 4 (four) times daily. X 7 days 06/19/14   Ria Clock, PA  doxycycline (VIBRAMYCIN) 100 MG capsule Take 1 capsule (100 mg total) by mouth 2 (two) times daily. 09/04/15   Rolland Porter, MD  EPINEPHrine 0.3 mg/0.3 mL IJ SOAJ injection Inject 0.3 mLs (0.3 mg total) into the muscle once. 08/27/14   Gwyneth Sprout, MD  indomethacin (INDOCIN) 25 MG capsule Take 1 capsule (25 mg total) by mouth 3 (three) times daily as needed for mild pain or moderate pain. 06/19/14   Mathis Fare Presson, PA  naproxen (NAPROSYN) 500 MG tablet Take 1 tablet (500 mg total) by mouth 2 (two) times daily. 04/13/14   Arthor Captain, PA-C  predniSONE (DELTASONE) 20 MG tablet Take 2 tablets (40 mg total) by mouth daily. 08/27/14   Gwyneth Sprout, MD   There were no vitals taken for this visit. Physical Exam  Constitutional: She is oriented to person, place, and time. She appears well-developed and well-nourished. No distress.  HENT:  Head: Normocephalic.  Eyes: Conjunctivae are normal. Pupils are equal, round, and reactive to light. No scleral icterus.  Neck: Normal range of motion. Neck supple. No thyromegaly present.  Cardiovascular: Normal rate and regular rhythm.  Exam reveals no gallop and no friction rub.   No murmur heard. Pulmonary/Chest: Effort normal and breath sounds normal. No respiratory distress. She has no wheezes.  She has no rales.  Abdominal: Soft. Bowel sounds are normal. She exhibits no distension. There is no tenderness. There is no rebound.  Musculoskeletal: Normal range of motion.  Neurological: She is alert and oriented to person, place, and time.  Skin: Skin is warm and dry. No rash noted.     Psychiatric: She has a normal mood and affect. Her behavior is normal.    ED Course  Procedures (including critical care time) Labs  Review Labs Reviewed - No data to display  Imaging Review No results found. I have personally reviewed and evaluated these images and lab results as part of my medical decision-making.   EKG Interpretation None      MDM   Final diagnoses:  Rash    Proximally a palm size circular/oval area of erythema and itching. No pain or frank induration. I'm advised antihistamines and anti-inflammatories. If this progresses beyond its current border, or begins to spread proximally, I have written her prescription for doxycycline. I have asked her to wait 48 hours for improvement. However, if this does progress as discussed and she will initiate it at that time.    Rolland PorterMark Avrum Kimball, MD 09/04/15 [phone]

## 2015-09-04 NOTE — Discharge Instructions (Signed)
Rash is likely secondary to an insect bite or sting.  Most of these will improve with time, or simple treatment with Motrin and Benadryl.  Take ibuprofen 400 mg 3 times per day, and Benadryl 25 mg 3 times per day until the rash is improving or resolved.  If not improving, or if this progresses as discussed, then fill and take prescription for doxycycline (antibiotic).

## 2015-09-04 NOTE — ED Notes (Signed)
Pt c/o insect bite/increasing rash on L forearm.  Pain score 3/10.  Redness and swelling noted to area.  Pt c/o severe itching.

## 2015-09-09 ENCOUNTER — Other Ambulatory Visit: Payer: Self-pay | Admitting: Family Medicine

## 2015-09-09 ENCOUNTER — Ambulatory Visit
Admission: RE | Admit: 2015-09-09 | Discharge: 2015-09-09 | Disposition: A | Discharge status: Home / Self Care | Payer: PRIVATE HEALTH INSURANCE | Source: Ambulatory Visit | Attending: Family Medicine | Admitting: Family Medicine

## 2015-09-09 DIAGNOSIS — L089 Local infection of the skin and subcutaneous tissue, unspecified: Secondary | ICD-10-CM

## 2015-09-09 DIAGNOSIS — S60460A Insect bite (nonvenomous) of right index finger, initial encounter: Principal | ICD-10-CM

## 2016-05-23 DIAGNOSIS — E6609 Other obesity due to excess calories: Secondary | ICD-10-CM | POA: Diagnosis not present

## 2016-05-23 DIAGNOSIS — M940 Chondrocostal junction syndrome [Tietze]: Secondary | ICD-10-CM | POA: Diagnosis not present

## 2016-05-23 DIAGNOSIS — R7309 Other abnormal glucose: Secondary | ICD-10-CM | POA: Diagnosis not present

## 2016-06-08 DIAGNOSIS — E669 Obesity, unspecified: Secondary | ICD-10-CM | POA: Diagnosis not present

## 2016-06-08 DIAGNOSIS — M940 Chondrocostal junction syndrome [Tietze]: Secondary | ICD-10-CM | POA: Diagnosis not present

## 2016-06-08 DIAGNOSIS — R21 Rash and other nonspecific skin eruption: Secondary | ICD-10-CM | POA: Diagnosis not present

## 2016-07-02 DIAGNOSIS — J399 Disease of upper respiratory tract, unspecified: Secondary | ICD-10-CM | POA: Diagnosis not present

## 2016-09-08 ENCOUNTER — Encounter (HOSPITAL_COMMUNITY): Payer: Self-pay | Admitting: Emergency Medicine

## 2016-09-08 ENCOUNTER — Emergency Department (HOSPITAL_COMMUNITY)
Admission: EM | Admit: 2016-09-08 | Discharge: 2016-09-08 | Disposition: A | Discharge status: Home / Self Care | Payer: BLUE CROSS/BLUE SHIELD | Attending: Emergency Medicine | Admitting: Emergency Medicine

## 2016-09-08 DIAGNOSIS — H6122 Impacted cerumen, left ear: Secondary | ICD-10-CM | POA: Diagnosis not present

## 2016-09-08 DIAGNOSIS — Z79899 Other long term (current) drug therapy: Secondary | ICD-10-CM | POA: Insufficient documentation

## 2016-09-08 DIAGNOSIS — H9192 Unspecified hearing loss, left ear: Secondary | ICD-10-CM | POA: Diagnosis not present

## 2016-09-08 NOTE — ED Triage Notes (Signed)
Pt c/o feeling like her ears are blocked. Symptoms onset when she woke up. Pt had her hair washed earlier today.

## 2016-09-08 NOTE — ED Notes (Signed)
Pt ambulated to room without difficulty

## 2016-09-08 NOTE — ED Notes (Signed)
Pt states she is now able to hear much better

## 2016-09-08 NOTE — ED Provider Notes (Signed)
[facility]-EMERGENCY DEPT Provider Note   CSN: [id number] Arrival date & time: 09/08/16  1830  By signing my name below, I, Ny'Kea Lewis, attest that this documentation has been prepared under the direction and in the presence of Melburn Hake, PA-C. Electronically Signed: Karren Cobble, ED Scribe. 09/08/16. 7:38 PM.  History   Chief Complaint Chief Complaint  Patient presents with  . Hearing Problem   The history is provided by the patient. No language interpreter was used.   HPI Comments: Kathryn Combs is a 46 y.o. female with no pertinent history, who presents to the Emergency Department complaining of sudden onset, persistent hearing loss on left that began PTA. Pt states after awaking from a nap she began to experience hearing loss which she describes as "pressure-like" in her left ear and "muffled" in her right ear. She tried to clean her ears afterwards with a Qtip, but reports no relief. Denies fever, ear pain, ear drainage/ swelling, headache, rhinorrhea, or sore throat.    History reviewed. No pertinent past medical history.  There are no active problems to display for this patient.   Past Surgical History:  Procedure Laterality Date  . CESAREAN SECTION      OB History    No data available       Home Medications    Prior to Admission medications   Medication Sig Start Date End Date Taking? Authorizing Provider  cephALEXin (KEFLEX) 500 MG capsule Take 1 capsule (500 mg total) by mouth 3 (three) times daily. 09/04/15   Rolland Porter, MD  EPINEPHrine 0.3 mg/0.3 mL IJ SOAJ injection Inject 0.3 mLs (0.3 mg total) into the muscle once. 08/27/14   Gwyneth Sprout, MD  indomethacin (INDOCIN) 25 MG capsule Take 1 capsule (25 mg total) by mouth 3 (three) times daily as needed for mild pain or moderate pain. 06/19/14   Presson, Mathis Fare, PA  naproxen (NAPROSYN) 500 MG tablet Take 1 tablet (500 mg total) by mouth 2 (two) times daily. 04/13/14   Arthor Captain, PA-C  predniSONE  (DELTASONE) 20 MG tablet Take 2 tablets (40 mg total) by mouth daily. 08/27/14   Gwyneth Sprout, MD    Family History Family History  Problem Relation Age of Onset  . Cancer Mother   . Cancer Father   . Hypertension Sister   . Hypertension Brother     Social History Social History  Substance Use Topics  . Smoking status: Never Smoker  . Smokeless tobacco: Never Used  . Alcohol use No     Allergies   Penicillins   Review of Systems Review of Systems  Constitutional: Negative for fever.  HENT: Positive for hearing loss. Negative for ear discharge, ear pain, rhinorrhea and sore throat.   Neurological: Negative for headaches.     Physical Exam Updated Vital Signs BP 140/70 (BP Location: Left Arm)   Pulse 65   Temp 97.9 F (36.6 C) (Oral)   Resp 18   Ht 5\' 7"  (1.702 m)   Wt 121.1 kg (267 lb)   LMP 09/05/2016   SpO2 98%   BMI 41.82 kg/m   Physical Exam  Constitutional: She is oriented to person, place, and time. She appears well-developed and well-nourished.  HENT:  Head: Normocephalic and atraumatic.  Right Ear: Hearing, tympanic membrane, external ear and ear canal normal. No mastoid tenderness.  Left Ear: External ear normal. No mastoid tenderness.  Mouth/Throat: Uvula is midline, oropharynx is clear and moist and mucous membranes are normal. No oropharyngeal exudate,  posterior oropharyngeal edema, posterior oropharyngeal erythema or tonsillar abscesses.  Left cerumen impaction, unable to visualize left TM. Hearing grossly intact.  Eyes: Pupils are equal, round, and reactive to light. Conjunctivae and EOM are normal. Right eye exhibits no discharge. Left eye exhibits no discharge. No scleral icterus.  Neck: Normal range of motion. Neck supple.  Cardiovascular: Normal rate, regular rhythm, normal heart sounds and intact distal pulses.   Pulmonary/Chest: Effort normal and breath sounds normal.  Abdominal: Soft. She exhibits no distension.  Musculoskeletal: She  exhibits no edema.  Neurological: She is alert and oriented to person, place, and time.  Skin: Skin is warm and dry.  Nursing note and vitals reviewed.   ED Treatments / Results  DIAGNOSTIC STUDIES: Oxygen Saturation is 98% on RA, normal by my interpretation.   COORDINATION OF CARE: 7:26 PM-Discussed next steps with pt. Pt verbalized understanding and is agreeable with the plan.   Labs (all labs ordered are listed, but only abnormal results are displayed) Labs Reviewed - No data to display  EKG  EKG Interpretation None       Radiology No results found.  Procedures .Ear Cerumen Removal Date/Time: 09/08/2016 8:16 PM Performed by: Barrett HenleNADEAU, NICOLE ELIZABETH Authorized by: Barrett HenleNADEAU, NICOLE ELIZABETH   Consent:    Consent obtained:  Verbal   Consent given by:  Patient Procedure details:    Location:  L ear   Procedure type: irrigation   Post-procedure details:    Inspection:  TM intact   Hearing quality:  Normal   Patient tolerance of procedure:  Tolerated well, no immediate complications   (including critical care time)  Medications Ordered in ED Medications - No data to display   Initial Impression / Assessment and Plan / ED Course  I have reviewed the triage vital signs and the nursing notes.  Pertinent labs & imaging results that were available during my care of the patient were reviewed by me and considered in my medical decision making (see chart for details).     Patient presents with decreased hearing to her left ear that started earlier today. Reports using Q-tips at home. Denies fever, swelling, ear drainage, ear pain. VSS. Exam revealed left cerumen impaction, unable to visualize TM. Ear was irrigated with successful removal of cerumen impaction. On reevaluation patient reports resolution of symptoms, reexamination revealed cleared left ear canal, TM intact. Patient discharged home and discuss home care including refraining from use of Q-tips.  Final  Clinical Impressions(s) / ED Diagnoses   Final diagnoses:  Impacted cerumen of left ear    New Prescriptions New Prescriptions   No medications on file   I personally performed the services described in this documentation, which was scribed in my presence. The recorded information has been reviewed and is accurate.     Barrett Henleadeau, Nicole Elizabeth, PA-C 09/08/16 2017    Jerelyn ScottLinker, Martha, MD 09/08/16 2056

## 2016-11-09 DIAGNOSIS — E669 Obesity, unspecified: Secondary | ICD-10-CM | POA: Diagnosis not present

## 2016-11-09 DIAGNOSIS — Z23 Encounter for immunization: Secondary | ICD-10-CM | POA: Diagnosis not present

## 2016-11-09 DIAGNOSIS — M533 Sacrococcygeal disorders, not elsewhere classified: Secondary | ICD-10-CM | POA: Diagnosis not present

## 2016-11-09 DIAGNOSIS — E0800 Diabetes mellitus due to underlying condition with hyperosmolarity without nonketotic hyperglycemic-hyperosmolar coma (NKHHC): Secondary | ICD-10-CM | POA: Diagnosis not present

## 2016-12-06 DIAGNOSIS — D500 Iron deficiency anemia secondary to blood loss (chronic): Secondary | ICD-10-CM | POA: Diagnosis not present

## 2016-12-06 DIAGNOSIS — N938 Other specified abnormal uterine and vaginal bleeding: Secondary | ICD-10-CM | POA: Diagnosis not present

## 2017-01-18 DIAGNOSIS — N938 Other specified abnormal uterine and vaginal bleeding: Secondary | ICD-10-CM | POA: Diagnosis not present

## 2017-01-18 DIAGNOSIS — D509 Iron deficiency anemia, unspecified: Secondary | ICD-10-CM | POA: Diagnosis not present

## 2017-02-02 DIAGNOSIS — M5431 Sciatica, right side: Secondary | ICD-10-CM | POA: Diagnosis not present

## 2017-02-02 DIAGNOSIS — L89159 Pressure ulcer of sacral region, unspecified stage: Secondary | ICD-10-CM | POA: Diagnosis not present

## 2017-02-02 DIAGNOSIS — M5430 Sciatica, unspecified side: Secondary | ICD-10-CM | POA: Diagnosis not present

## 2017-03-06 DIAGNOSIS — D509 Iron deficiency anemia, unspecified: Secondary | ICD-10-CM | POA: Diagnosis not present

## 2017-03-06 DIAGNOSIS — M5431 Sciatica, right side: Secondary | ICD-10-CM | POA: Diagnosis not present

## 2017-03-06 DIAGNOSIS — E119 Type 2 diabetes mellitus without complications: Secondary | ICD-10-CM | POA: Diagnosis not present

## 2017-03-08 DIAGNOSIS — R7303 Prediabetes: Secondary | ICD-10-CM | POA: Diagnosis not present

## 2017-03-08 DIAGNOSIS — D509 Iron deficiency anemia, unspecified: Secondary | ICD-10-CM | POA: Diagnosis not present

## 2017-03-08 DIAGNOSIS — Z6841 Body Mass Index (BMI) 40.0 and over, adult: Secondary | ICD-10-CM | POA: Diagnosis not present

## 2017-06-03 IMAGING — CR DG HAND COMPLETE 3+V*R*
3 series · 3 of 3 positions shown · non-contrast
Comparison: None.

CLINICAL DATA: Acute pain and swelling of right index and middle
fingers. Possible insect bite.

EXAM:
RIGHT HAND - COMPLETE 3+ VIEW

[view not recorded (1 of 3)]
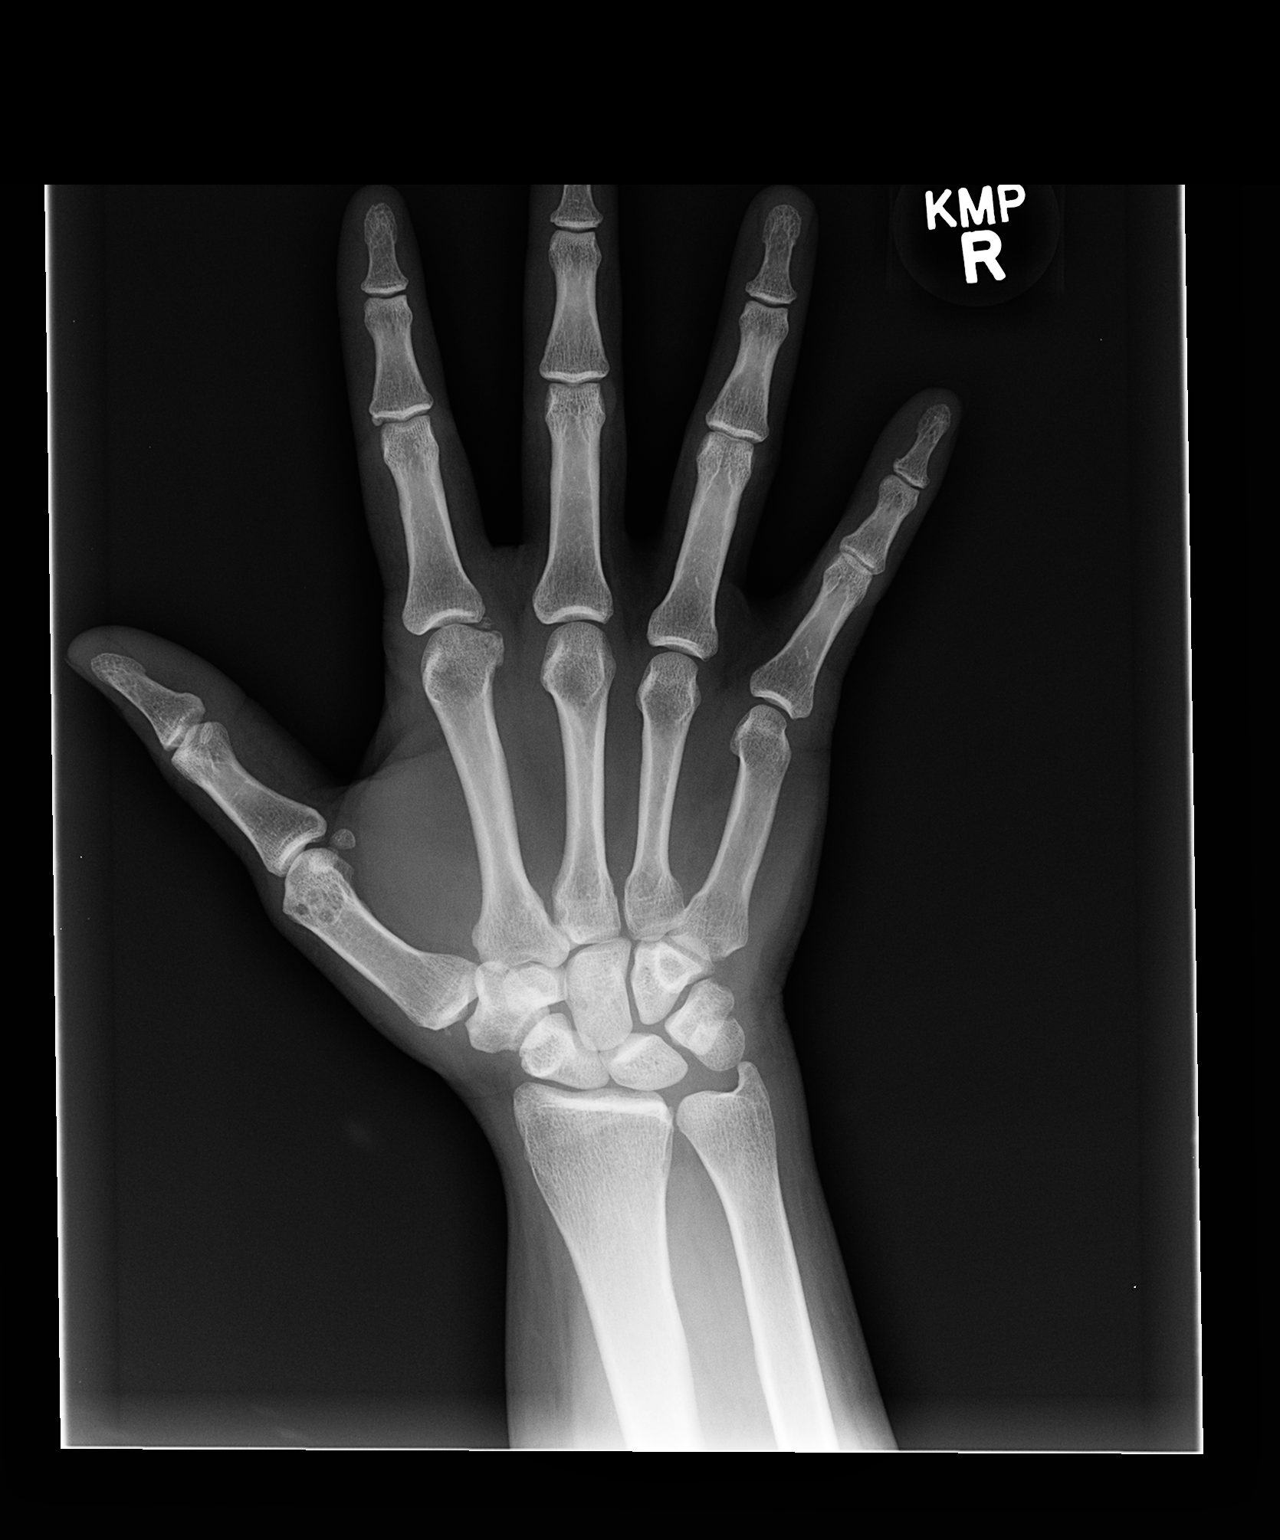

[view not recorded (2 of 3)]
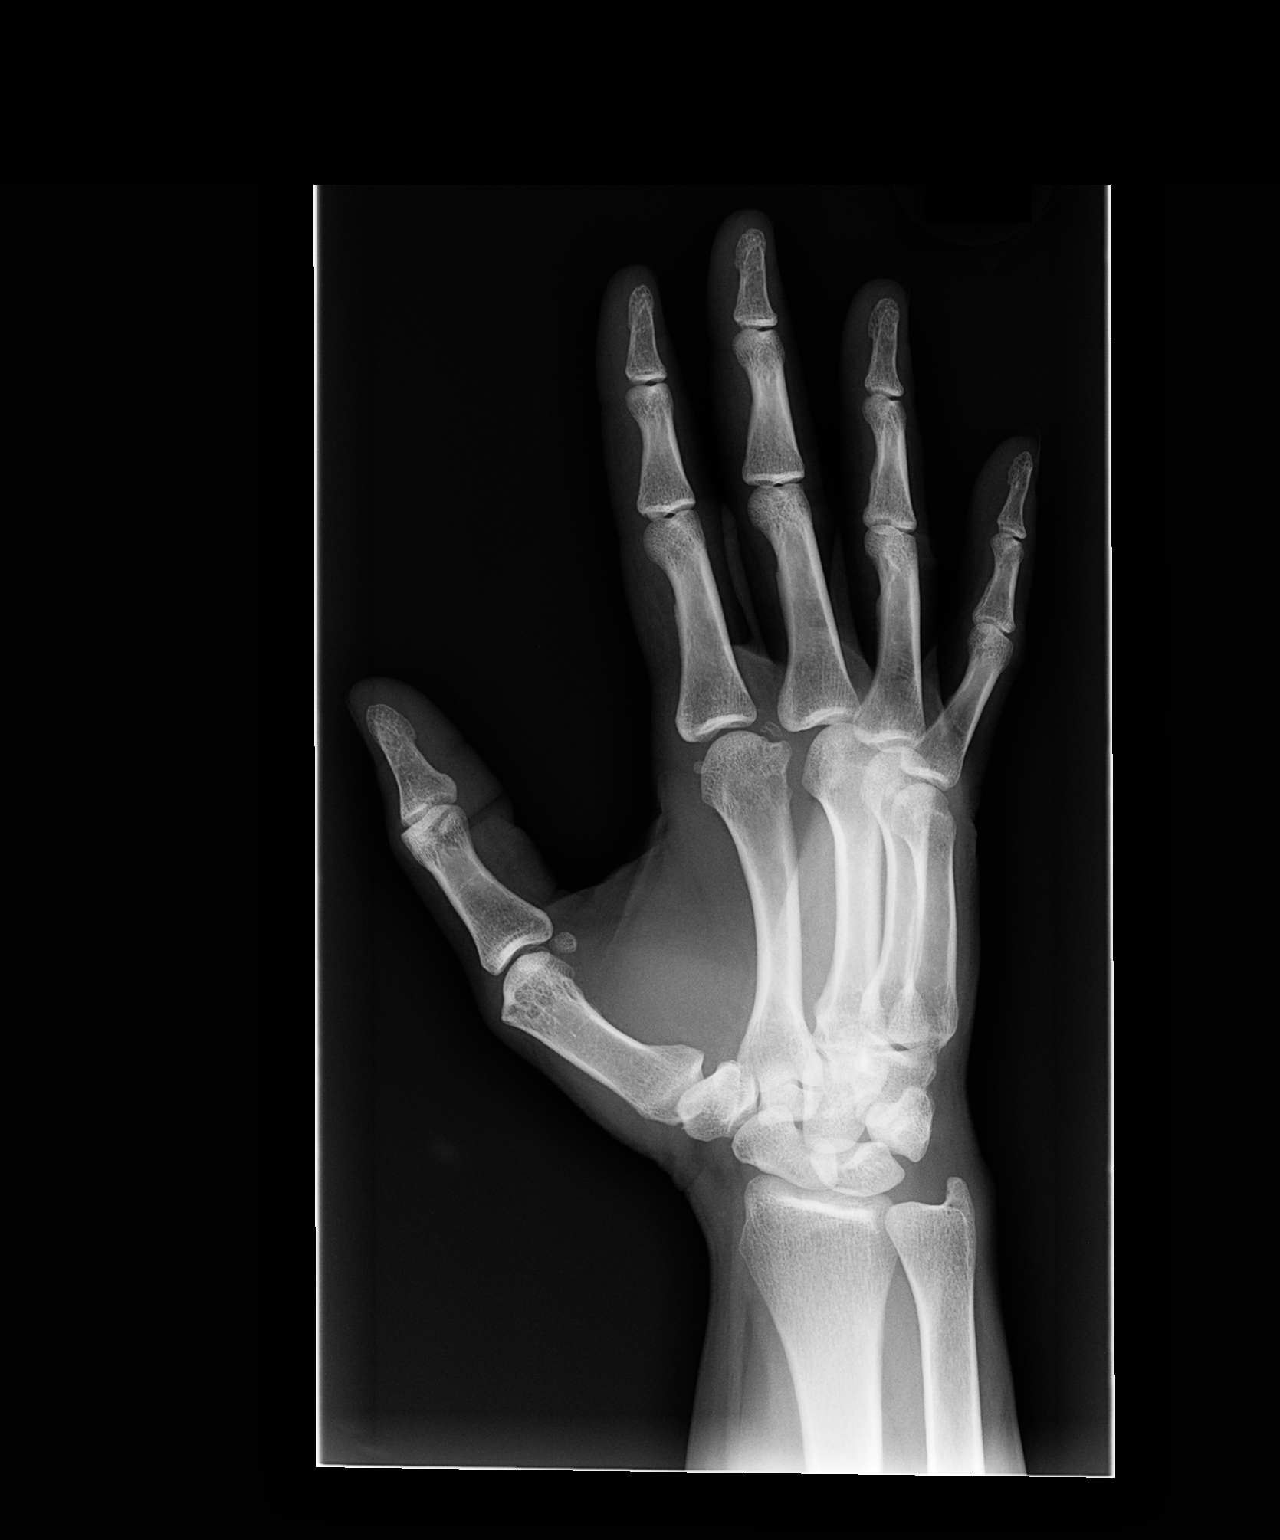

[view not recorded (3 of 3)]
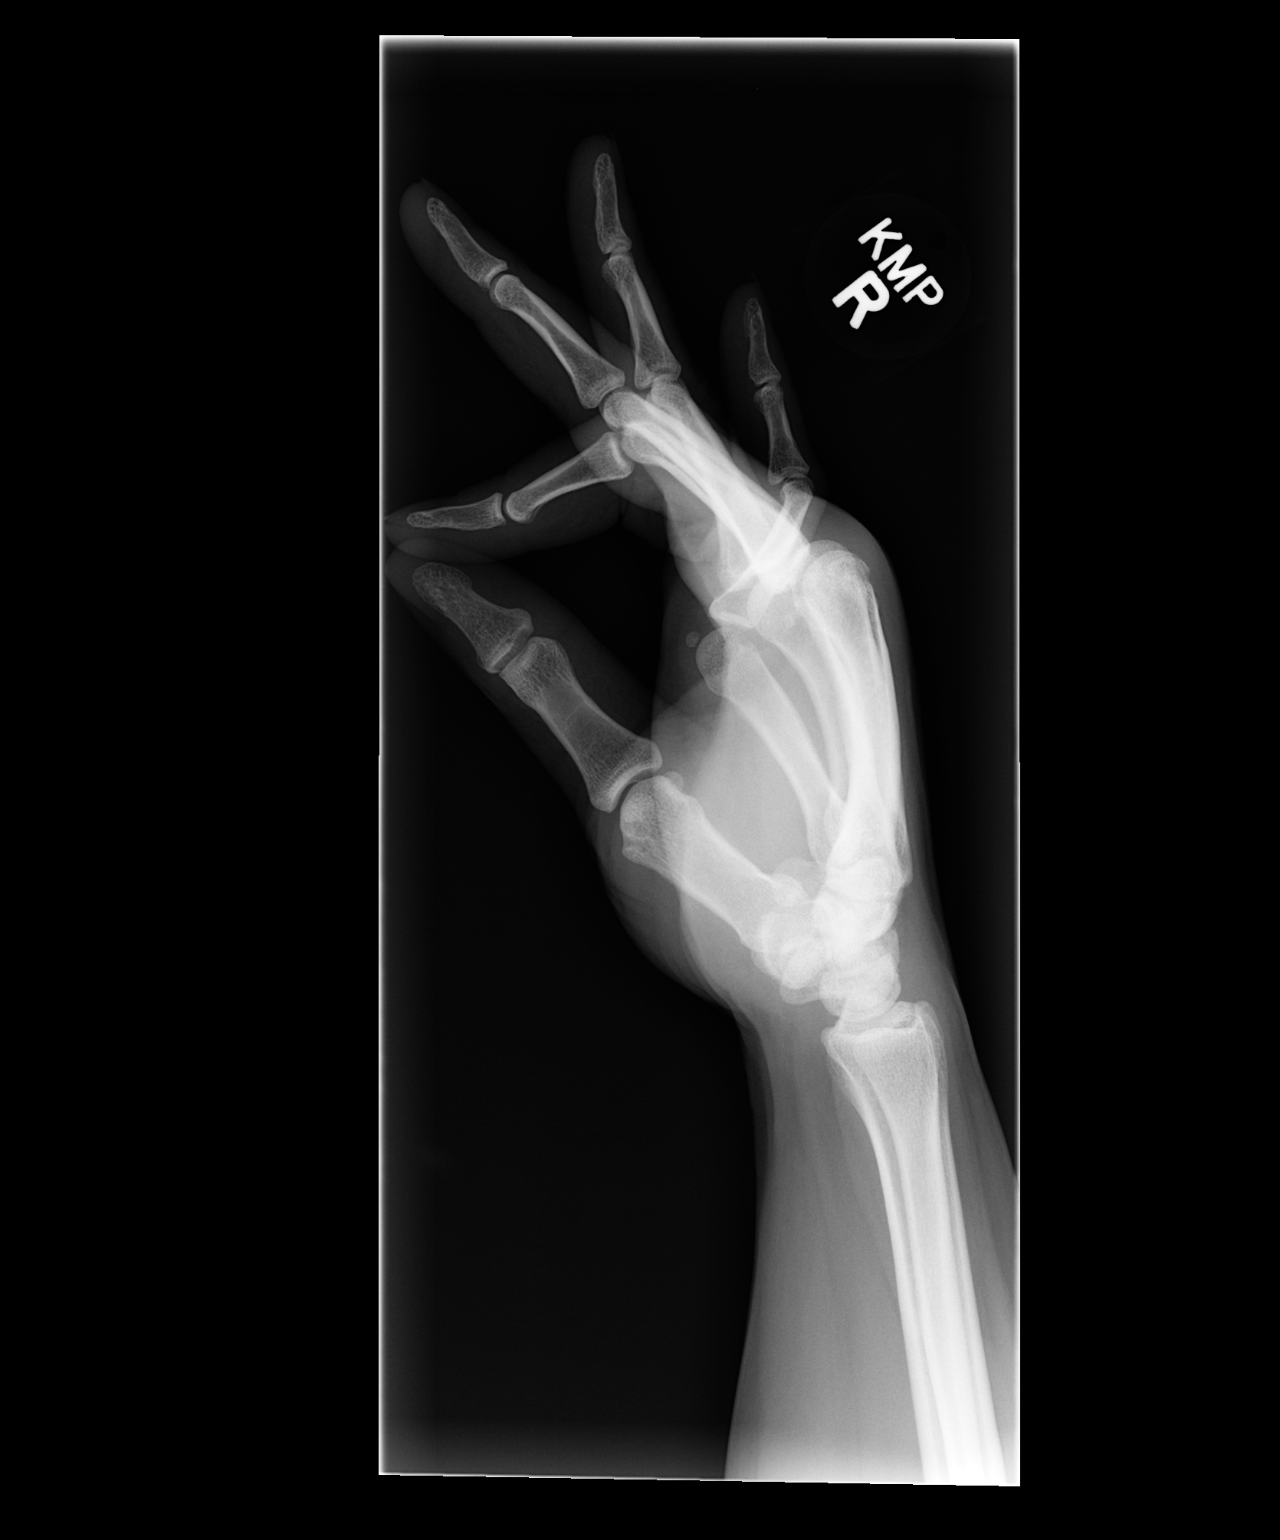

[3 of 3 positions shown; findings below may reference images not displayed]

FINDINGS: There is no evidence of fracture or dislocation. There is no
evidence of arthropathy or other focal bone abnormality. Soft
tissues are unremarkable.
IMPRESSION: No acute abnormality seen in the right hand.

## 2017-06-14 DIAGNOSIS — E0800 Diabetes mellitus due to underlying condition with hyperosmolarity without nonketotic hyperglycemic-hyperosmolar coma (NKHHC): Secondary | ICD-10-CM | POA: Diagnosis not present

## 2017-06-14 DIAGNOSIS — E6609 Other obesity due to excess calories: Secondary | ICD-10-CM | POA: Diagnosis not present

## 2017-07-26 DIAGNOSIS — E669 Obesity, unspecified: Secondary | ICD-10-CM | POA: Diagnosis not present

## 2017-07-26 DIAGNOSIS — R7303 Prediabetes: Secondary | ICD-10-CM | POA: Diagnosis not present

## 2017-10-31 DIAGNOSIS — E0800 Diabetes mellitus due to underlying condition with hyperosmolarity without nonketotic hyperglycemic-hyperosmolar coma (NKHHC): Secondary | ICD-10-CM | POA: Diagnosis not present

## 2017-10-31 DIAGNOSIS — D509 Iron deficiency anemia, unspecified: Secondary | ICD-10-CM | POA: Diagnosis not present

## 2017-10-31 DIAGNOSIS — R7303 Prediabetes: Secondary | ICD-10-CM | POA: Diagnosis not present

## 2017-11-04 DIAGNOSIS — M130 Polyarthritis, unspecified: Secondary | ICD-10-CM | POA: Diagnosis not present

## 2017-11-04 DIAGNOSIS — R609 Edema, unspecified: Secondary | ICD-10-CM | POA: Diagnosis not present

## 2017-11-04 DIAGNOSIS — R7309 Other abnormal glucose: Secondary | ICD-10-CM | POA: Diagnosis not present

## 2017-11-04 DIAGNOSIS — E6609 Other obesity due to excess calories: Secondary | ICD-10-CM | POA: Diagnosis not present

## 2018-03-21 DIAGNOSIS — J301 Allergic rhinitis due to pollen: Secondary | ICD-10-CM | POA: Diagnosis not present

## 2018-03-21 DIAGNOSIS — J0100 Acute maxillary sinusitis, unspecified: Secondary | ICD-10-CM | POA: Diagnosis not present

## 2018-03-21 DIAGNOSIS — R7309 Other abnormal glucose: Secondary | ICD-10-CM | POA: Diagnosis not present

## 2018-03-21 DIAGNOSIS — R202 Paresthesia of skin: Secondary | ICD-10-CM | POA: Diagnosis not present

## 2018-06-18 ENCOUNTER — Other Ambulatory Visit: Payer: Self-pay

## 2018-06-18 ENCOUNTER — Ambulatory Visit
Admission: RE | Admit: 2018-06-18 | Discharge: 2018-06-18 | Disposition: A | Discharge status: Home / Self Care | Payer: BLUE CROSS/BLUE SHIELD | Source: Ambulatory Visit | Attending: Family Medicine | Admitting: Family Medicine

## 2018-06-18 ENCOUNTER — Other Ambulatory Visit: Payer: Self-pay | Admitting: Family Medicine

## 2018-06-18 DIAGNOSIS — S90852A Superficial foreign body, left foot, initial encounter: Secondary | ICD-10-CM

## 2018-06-19 ENCOUNTER — Encounter: Payer: Self-pay | Admitting: Podiatry

## 2018-06-19 ENCOUNTER — Ambulatory Visit (INDEPENDENT_AMBULATORY_CARE_PROVIDER_SITE_OTHER): Payer: BLUE CROSS/BLUE SHIELD | Admitting: Podiatry

## 2018-06-19 ENCOUNTER — Ambulatory Visit: Payer: Self-pay

## 2018-06-19 VITALS — BP 99/59 | HR 98 | Temp 97.7°F

## 2018-06-19 DIAGNOSIS — W273XXA Contact with needle (sewing), initial encounter: Secondary | ICD-10-CM

## 2018-06-19 DIAGNOSIS — S90852D Superficial foreign body, left foot, subsequent encounter: Secondary | ICD-10-CM | POA: Diagnosis not present

## 2018-06-19 DIAGNOSIS — M79672 Pain in left foot: Secondary | ICD-10-CM

## 2018-06-19 DIAGNOSIS — S90852A Superficial foreign body, left foot, initial encounter: Secondary | ICD-10-CM | POA: Diagnosis not present

## 2018-06-19 MED ORDER — CLINDAMYCIN HCL 150 MG PO CAPS: 150.0000 mg | ORAL_CAPSULE | Freq: Two times a day (BID) | ORAL | 0 refills | Status: DC

## 2018-06-19 NOTE — Progress Notes (Signed)
Subjective:  Patient ID: Kathryn Combs, female    DOB: 1970-08-10,  MRN: [id number]  Chief Complaint  Patient presents with  . Foot Pain    L-medial of great toe; "stepped on a sewing needle about a week ago; think piece of needle broke off in foot; very tender to touch"    48 y.o. female presents with the above complaint.  States that she stepped on a sewing needle 2 days ago  Review of Systems: Negative except as noted in the HPI. Denies N/V/F/Ch.  History reviewed. No pertinent past medical history.  Current Outpatient Medications:  .  cephALEXin (KEFLEX) 500 MG capsule, Take 1 capsule (500 mg total) by mouth 3 (three) times daily. (Patient not taking: Reported on 06/19/2018), Disp: 21 capsule, Rfl: 0 .  clindamycin (CLEOCIN) 150 MG capsule, Take 1 capsule (150 mg total) by mouth 2 (two) times daily., Disp: 14 capsule, Rfl: 0 .  EPINEPHrine 0.3 mg/0.3 mL IJ SOAJ injection, Inject 0.3 mLs (0.3 mg total) into the muscle once. (Patient not taking: Reported on 06/19/2018), Disp: 1 Device, Rfl: 1 .  indomethacin (INDOCIN) 25 MG capsule, Take 1 capsule (25 mg total) by mouth 3 (three) times daily as needed for mild pain or moderate pain. (Patient not taking: Reported on 06/19/2018), Disp: 30 capsule, Rfl: 0 .  naproxen (NAPROSYN) 500 MG tablet, Take 1 tablet (500 mg total) by mouth 2 (two) times daily. (Patient not taking: Reported on 06/19/2018), Disp: 30 tablet, Rfl: 0 .  predniSONE (DELTASONE) 20 MG tablet, Take 2 tablets (40 mg total) by mouth daily. (Patient not taking: Reported on 06/19/2018), Disp: 6 tablet, Rfl: 0  Social History   Tobacco Use  Smoking Status Never Smoker  Smokeless Tobacco Never Used    Allergies  Allergen Reactions  . Penicillins    Objective:   Vitals:   06/19/18 1142  BP: (!) 99/59  Pulse: 98  Temp: 97.7 F (36.5 C)   There is no height or weight on file to calculate BMI. Constitutional Well developed. Well nourished.  Vascular Dorsalis pedis  pulses palpable bilaterally. Posterior tibial pulses palpable bilaterally. Capillary refill normal to all digits.  No cyanosis or clubbing noted. Pedal hair growth normal.  Neurologic Normal speech. Oriented to person, place, and time. Epicritic sensation to light touch grossly present bilaterally.  Dermatologic Nails well groomed and normal in appearance. No open wounds. No skin lesions.  Orthopedic:  Pain to palpation about the right plantar medial first metatarsal area with area of prior puncture wound   Radiographs: Prior x-rays reviewed 1 cm foreign body with appearance of a needle in the sub-cutaneous tissues of the foot Assessment:   1. Foreign body in left foot, initial encounter   2. Contact with sewing needle, initial encounter    Plan:  Patient was evaluated and treated and all questions answered.  Foreign body left foot -Prior x-rays reviewed -Rx clindamycin -Attempted removal, unsuccessful today.  See full procedure note -Discussed surgical removal of the needle.  Risk benefits alternative surgery were discussed with the patient  Procedure: Removal foreign body left foot Anesthesia: 3 cc of 1% lidocaine plain half percent Marcaine plain local block followed by 2 mL's lidocaine 1% with epinephrine Instrumentation: 15 blade Technique: Following anesthetization and sterile skin prep, a 15 blade was used to incise the skin under sterile conditions.  The area was explored with a hemostat.  There was slight return of purulent material several attempts were made to encounter the foreign body  for removal however it was unsuccessful.  Because the needle was felt to be deeper than subcutaneous tissue and possibly intimate to the joint capsule no further dissection in the office environment was indicated and I discussed the patient that she would benefit from the procedure in the operating room setting.  The procedure was then terminated.  The incision was cleansed and closed with  5-0 nylon and dressed with a dry sterile compression dressing Dressing: Dry, sterile, compression dressing. Disposition: Patient tolerated procedure well. Patient to return in 1 week for follow-up.   No follow-ups on file.

## 2018-06-19 NOTE — Patient Instructions (Signed)
Pre-Operative Instructions  Congratulations, you have decided to take an important step towards improving your quality of life.  You can be assured that the doctors and staff at [facility] will be with you every step of the way.  Here are some important things you should know:  1. Plan to be at the surgery center/hospital at least 1 (one) hour prior to your scheduled time, unless otherwise directed by the surgical center/hospital staff.  You must have a responsible adult accompany you, remain during the surgery and drive you home.  Make sure you have directions to the surgical center/hospital to ensure you arrive on time. 2. If you are having surgery at [facility] or [facility], you will need a copy of your medical history and physical form from your family physician within one month prior to the date of surgery. We will give you a form for your primary physician to complete.  3. We make every effort to accommodate the date you request for surgery.  However, there are times where surgery dates or times have to be moved.  We will contact you as soon as possible if a change in schedule is required.   4. No aspirin/ibuprofen for one week before surgery.  If you are on aspirin, any non-steroidal anti-inflammatory medications (Mobic, Aleve, Ibuprofen) should not be taken seven (7) days prior to your surgery.  You make take Tylenol for pain prior to surgery.  5. Medications - If you are taking daily heart and blood pressure medications, seizure, reflux, allergy, asthma, anxiety, pain or diabetes medications, make sure you notify the surgery center/hospital before the day of surgery so they can tell you which medications you should take or avoid the day of surgery. 6. No food or drink after midnight the night before surgery unless directed otherwise by surgical center/hospital staff. 7. No alcoholic beverages 24-hours prior to surgery.  No smoking 24-hours prior or 24-hours after  surgery. 8. Wear loose pants or shorts. They should be loose enough to fit over bandages, boots, and casts. 9. Don't wear slip-on shoes. Sneakers are preferred. 10. Bring your boot with you to the surgery center/hospital.  Also bring crutches or a walker if your physician has prescribed it for you.  If you do not have this equipment, it will be provided for you after surgery. 11. If you have not been contacted by the surgery center/hospital by the day before your surgery, call to confirm the date and time of your surgery. 12. Leave-time from work may vary depending on the type of surgery you have.  Appropriate arrangements should be made prior to surgery with your employer. 13. Prescriptions will be provided immediately following surgery by your doctor.  Fill these as soon as possible after surgery and take the medication as directed. Pain medications will not be refilled on weekends and must be approved by the doctor. 14. Remove nail polish on the operative foot and avoid getting pedicures prior to surgery. 15. Wash the night before surgery.  The night before surgery wash the foot and leg well with water and the antibacterial soap provided. Be sure to pay special attention to beneath the toenails and in between the toes.  Wash for at least three (3) minutes. Rinse thoroughly with water and dry well with a towel.  Perform this wash unless told not to do so by your physician.  Enclosed: 1 Ice pack (please put in freezer the night before surgery)   1 Hibiclens skin cleaner     Pre-op instructions  If you have any questions regarding the instructions, please do not hesitate to call our office.  [place]: [address], [place], [place] [address] -- [phone]  [place]: [address] [place], [place] [address] -- [phone]  [place]: [address]  [place], [place] [address] -- [phone]  [place]: [address], [address], [place],  [address] -- [phone]  Website:  [email or web address] 

## 2018-06-20 ENCOUNTER — Telehealth: Payer: Self-pay | Admitting: *Deleted

## 2018-06-20 NOTE — Telephone Encounter (Signed)
DOS 06/25/2018 CPT 33007 REMOVAL FOREIGN BODY LEFT FOOT  I spoke to [place] at [employer].  She stated authorization is not required for the surgery. She stated the patient has a $1000 individual deductible, once it's met insurance will cover 80%. She has a $5000 out of pocket maximum. The reference number for that call is [id number]/[id number]/2018. I also spoke to Lorelle Gibbs. in benefits and she said informed me that the patient's eligibility started on 11/19/2017 and that she is still active. The reference number for the call with Lorelle Gibbs. is [id number]. Lorelle Gibbs. also stated that their system is updating the prefix is FVI.

## 2018-06-25 ENCOUNTER — Other Ambulatory Visit: Payer: Self-pay | Admitting: Podiatry

## 2018-06-25 DIAGNOSIS — L923 Foreign body granuloma of the skin and subcutaneous tissue: Secondary | ICD-10-CM | POA: Diagnosis not present

## 2018-06-25 DIAGNOSIS — M9272 Juvenile osteochondrosis of metatarsus, left foot: Secondary | ICD-10-CM | POA: Diagnosis not present

## 2018-06-25 DIAGNOSIS — R011 Cardiac murmur, unspecified: Secondary | ICD-10-CM | POA: Diagnosis not present

## 2018-06-25 DIAGNOSIS — Y999 Unspecified external cause status: Secondary | ICD-10-CM | POA: Diagnosis not present

## 2018-06-25 DIAGNOSIS — W268XXA Contact with other sharp object(s), not elsewhere classified, initial encounter: Secondary | ICD-10-CM | POA: Diagnosis not present

## 2018-06-25 DIAGNOSIS — Y939 Activity, unspecified: Secondary | ICD-10-CM | POA: Diagnosis not present

## 2018-06-25 DIAGNOSIS — S90852A Superficial foreign body, left foot, initial encounter: Secondary | ICD-10-CM | POA: Diagnosis not present

## 2018-06-25 DIAGNOSIS — Y929 Unspecified place or not applicable: Secondary | ICD-10-CM | POA: Diagnosis not present

## 2018-06-25 MED ORDER — CLINDAMYCIN HCL 150 MG PO CAPS: 150.0000 mg | ORAL_CAPSULE | Freq: Two times a day (BID) | ORAL | 0 refills | Status: DC

## 2018-06-25 MED ORDER — OXYCODONE-ACETAMINOPHEN 5-325 MG PO TABS: 1.0000 | ORAL_TABLET | ORAL | 0 refills | Status: DC | PRN

## 2018-06-25 NOTE — Progress Notes (Signed)
Rx sent to pharmacy for outpatient surgery. °

## 2018-06-26 ENCOUNTER — Encounter: Payer: Self-pay | Admitting: Podiatry

## 2018-06-26 ENCOUNTER — Telehealth: Payer: Self-pay

## 2018-06-26 NOTE — Telephone Encounter (Signed)
POST OP CALL-    1) General condition stated by the patient: okay  2) Is the pt having pain? some  3) Pain score:   4) Has the pt taken Rx'd pain medication, regularly or PRN?   5) Is the pain medication giving relief? yes  6) Any fever, chills, nausea, or vomiting, shortness of breath or tightness in calf? no  7) Is the bandage clean, dry and intact? yes  8) Is there excessive tightness, bleeding or drainage coming through the bandage? no  9) Did you understand all of the post op instruction sheet given? yes  10) Any questions or concerns regarding post op care/recovery? No     Confirmed POV appointment with patient

## 2018-06-27 ENCOUNTER — Telehealth: Payer: Self-pay | Admitting: *Deleted

## 2018-06-27 NOTE — Telephone Encounter (Signed)
Pt states her dressing is slipping.

## 2018-06-27 NOTE — Telephone Encounter (Signed)
I called pt and instructed pt to rewrap the ace to hold the dressing in place or with a little tape. Pt states understanding.

## 2018-07-03 ENCOUNTER — Telehealth: Payer: Self-pay | Admitting: *Deleted

## 2018-07-03 NOTE — Telephone Encounter (Signed)
Is she still taking pain medicine? She could try some benadryl too.

## 2018-07-03 NOTE — Telephone Encounter (Signed)
Pt states she had surgery 06/25/2018, and her foot is itching.

## 2018-07-03 NOTE — Telephone Encounter (Signed)
I called pt and asked if the itching was annoying or unbearable. Pt states it is unbearable. I asked pt if she could see any redness or swelling or drainage on or around the dressing. Pt states she has taken the dressing off and there is a little redness a little way from the surgery site. I told pt to wash the foot with wash cloth and warm water, not to soak or wash over the surgery site and then rewrap with sterile dressing, and not to scratch. Pt states understanding.

## 2018-07-04 ENCOUNTER — Other Ambulatory Visit: Payer: Self-pay

## 2018-07-04 ENCOUNTER — Ambulatory Visit (INDEPENDENT_AMBULATORY_CARE_PROVIDER_SITE_OTHER): Payer: Medicaid Other | Admitting: Podiatry

## 2018-07-04 VITALS — Temp 98.6°F

## 2018-07-04 DIAGNOSIS — S90852D Superficial foreign body, left foot, subsequent encounter: Secondary | ICD-10-CM

## 2018-07-04 NOTE — Telephone Encounter (Signed)
I called pt and asked how the itching was and she stated it is much better.

## 2018-07-10 ENCOUNTER — Other Ambulatory Visit: Payer: Self-pay

## 2018-07-10 ENCOUNTER — Ambulatory Visit (INDEPENDENT_AMBULATORY_CARE_PROVIDER_SITE_OTHER): Payer: Self-pay | Admitting: Podiatry

## 2018-07-10 DIAGNOSIS — S90852A Superficial foreign body, left foot, initial encounter: Secondary | ICD-10-CM

## 2018-07-10 DIAGNOSIS — W273XXD Contact with needle (sewing), subsequent encounter: Secondary | ICD-10-CM

## 2018-07-10 NOTE — Progress Notes (Signed)
  Subjective:  Patient ID: Kathryn Combs, female    DOB: 26-Jan-1971,  MRN: [id number]  Chief Complaint  Patient presents with  . Routine Post Op     DOS 06/25/2018 REMOVAL FOREIGN BODY LT   DOS: 06/25/2018 Procedure: Removal of foreign body left foot.  48 y.o. female returns for post-op check. Some soreness but otherwise no pain or discomfort.  Review of Systems: Negative except as noted in the HPI. Denies N/V/F/Ch.  No past medical history on file.  Current Outpatient Medications:  .  cephALEXin (KEFLEX) 500 MG capsule, Take 1 capsule (500 mg total) by mouth 3 (three) times daily., Disp: 21 capsule, Rfl: 0 .  clindamycin (CLEOCIN) 150 MG capsule, Take 1 capsule (150 mg total) by mouth 2 (two) times daily., Disp: 14 capsule, Rfl: 0 .  EPINEPHrine 0.3 mg/0.3 mL IJ SOAJ injection, Inject 0.3 mLs (0.3 mg total) into the muscle once., Disp: 1 Device, Rfl: 1 .  indomethacin (INDOCIN) 25 MG capsule, Take 1 capsule (25 mg total) by mouth 3 (three) times daily as needed for mild pain or moderate pain., Disp: 30 capsule, Rfl: 0 .  naproxen (NAPROSYN) 500 MG tablet, Take 1 tablet (500 mg total) by mouth 2 (two) times daily., Disp: 30 tablet, Rfl: 0 .  oxyCODONE-acetaminophen (PERCOCET) 5-325 MG tablet, Take 1 tablet by mouth every 4 (four) hours as needed for severe pain., Disp: 20 tablet, Rfl: 0 .  predniSONE (DELTASONE) 20 MG tablet, Take 2 tablets (40 mg total) by mouth daily., Disp: 6 tablet, Rfl: 0  Social History   Tobacco Use  Smoking Status Never Smoker  Smokeless Tobacco Never Used    Allergies  Allergen Reactions  . Penicillins    Objective:  There were no vitals filed for this visit. There is no height or weight on file to calculate BMI. Constitutional Well developed. Well nourished.  Vascular Foot warm and well perfused. Capillary refill normal to all digits.   Neurologic Normal speech. Oriented to person, place, and time. Epicritic sensation to light touch grossly  present bilaterally.  Dermatologic Skin well healed.  Orthopedic: Tenderness to palpation noted about the surgical site.   Radiographs: None Assessment:   1. Foreign body in left foot, initial encounter   2. Contact with sewing needle, subsequent encounter    Plan:  Patient was evaluated and treated and all questions answered.  S/p foot surgery left -Progressing as expected post-operatively. -XR: None -WB Status: none. -Sutures: out. -Medications: None -Foot redressed.  No follow-ups on file.

## 2018-07-11 DIAGNOSIS — I10 Essential (primary) hypertension: Secondary | ICD-10-CM | POA: Diagnosis not present

## 2018-07-11 DIAGNOSIS — E1169 Type 2 diabetes mellitus with other specified complication: Secondary | ICD-10-CM | POA: Diagnosis not present

## 2018-07-19 NOTE — Progress Notes (Signed)
  Subjective:  Patient ID: Kathryn Combs, female    DOB: [birth year],  MRN: [id number]  Chief Complaint  Patient presents with  . Routine Post Op    EXC foriegn body LT: " my foot hurt pretty bad the first few days after surgery buit now I am only taking tylenol"     DOS: 06/25/2018 Procedure: Removal of FB Left Foot  48 y.o. female returns for post-op check. History as above.  Review of Systems: Negative except as noted in the HPI. Denies N/V/F/Ch.  History reviewed. No pertinent past medical history.  Current Outpatient Medications:  .  cephALEXin (KEFLEX) 500 MG capsule, Take 1 capsule (500 mg total) by mouth 3 (three) times daily., Disp: 21 capsule, Rfl: 0 .  clindamycin (CLEOCIN) 150 MG capsule, Take 1 capsule (150 mg total) by mouth 2 (two) times daily., Disp: 14 capsule, Rfl: 0 .  EPINEPHrine 0.3 mg/0.3 mL IJ SOAJ injection, Inject 0.3 mLs (0.3 mg total) into the muscle once., Disp: 1 Device, Rfl: 1 .  indomethacin (INDOCIN) 25 MG capsule, Take 1 capsule (25 mg total) by mouth 3 (three) times daily as needed for mild pain or moderate pain., Disp: 30 capsule, Rfl: 0 .  naproxen (NAPROSYN) 500 MG tablet, Take 1 tablet (500 mg total) by mouth 2 (two) times daily., Disp: 30 tablet, Rfl: 0 .  oxyCODONE-acetaminophen (PERCOCET) 5-325 MG tablet, Take 1 tablet by mouth every 4 (four) hours as needed for severe pain., Disp: 20 tablet, Rfl: 0 .  predniSONE (DELTASONE) 20 MG tablet, Take 2 tablets (40 mg total) by mouth daily., Disp: 6 tablet, Rfl: 0  Social History   Tobacco Use  Smoking Status Never Smoker  Smokeless Tobacco Never Used    Allergies  Allergen Reactions  . Penicillins    Objective:   Vitals:   07/04/18 1249  Temp: 98.6 F (37 C)   There is no height or weight on file to calculate BMI. Constitutional Well developed. Well nourished.  Vascular Foot warm and well perfused. Capillary refill normal to all digits.   Neurologic Normal speech. Oriented to person,  place, and time. Epicritic sensation to light touch grossly present bilaterally.  Dermatologic Skin healing well without signs of infection. Skin edges well coapted without signs of infection.  Orthopedic: Tenderness to palpation noted about the surgical site.   Radiographs: None Assessment:   1. Foreign body in left foot, subsequent encounter    Plan:  Patient was evaluated and treated and all questions answered.  S/p foot surgery left -Progressing as expected post-operatively. -XR: None -WB Status: WBAT in boot. -Sutures: intact. -Medications: none refilled. -Foot redressed.  No follow-ups on file.

## 2018-07-28 DIAGNOSIS — M542 Cervicalgia: Secondary | ICD-10-CM | POA: Diagnosis not present

## 2018-07-28 DIAGNOSIS — M5412 Radiculopathy, cervical region: Secondary | ICD-10-CM | POA: Diagnosis not present

## 2018-11-21 DIAGNOSIS — I10 Essential (primary) hypertension: Secondary | ICD-10-CM | POA: Diagnosis not present

## 2018-11-21 DIAGNOSIS — E6609 Other obesity due to excess calories: Secondary | ICD-10-CM | POA: Diagnosis not present

## 2018-11-21 DIAGNOSIS — R7309 Other abnormal glucose: Secondary | ICD-10-CM | POA: Diagnosis not present

## 2018-11-21 DIAGNOSIS — M130 Polyarthritis, unspecified: Secondary | ICD-10-CM | POA: Diagnosis not present

## 2019-01-23 DIAGNOSIS — R51 Headache with orthostatic component, not elsewhere classified: Secondary | ICD-10-CM | POA: Diagnosis not present

## 2019-01-27 ENCOUNTER — Other Ambulatory Visit: Payer: Self-pay

## 2019-01-27 DIAGNOSIS — Z20822 Contact with and (suspected) exposure to covid-19: Secondary | ICD-10-CM

## 2019-01-29 LAB — NOVEL CORONAVIRUS, NAA: SARS-CoV-2, NAA: NOT DETECTED

## 2019-04-22 DIAGNOSIS — U071 COVID-19: Secondary | ICD-10-CM | POA: Diagnosis not present

## 2019-04-22 DIAGNOSIS — Z03818 Encounter for observation for suspected exposure to other biological agents ruled out: Secondary | ICD-10-CM | POA: Diagnosis not present

## 2019-04-22 DIAGNOSIS — Z20828 Contact with and (suspected) exposure to other viral communicable diseases: Secondary | ICD-10-CM | POA: Diagnosis not present

## 2019-05-08 DIAGNOSIS — U071 COVID-19: Secondary | ICD-10-CM | POA: Diagnosis not present

## 2019-05-08 DIAGNOSIS — R0602 Shortness of breath: Secondary | ICD-10-CM | POA: Diagnosis not present

## 2019-05-08 DIAGNOSIS — R5383 Other fatigue: Secondary | ICD-10-CM | POA: Diagnosis not present

## 2019-10-16 DIAGNOSIS — E1169 Type 2 diabetes mellitus with other specified complication: Secondary | ICD-10-CM | POA: Diagnosis not present

## 2019-10-16 DIAGNOSIS — I10 Essential (primary) hypertension: Secondary | ICD-10-CM | POA: Diagnosis not present

## 2019-10-16 DIAGNOSIS — R7309 Other abnormal glucose: Secondary | ICD-10-CM | POA: Diagnosis not present

## 2019-10-16 DIAGNOSIS — E6609 Other obesity due to excess calories: Secondary | ICD-10-CM | POA: Diagnosis not present

## 2019-10-16 DIAGNOSIS — N951 Menopausal and female climacteric states: Secondary | ICD-10-CM | POA: Diagnosis not present

## 2019-10-30 DIAGNOSIS — R7303 Prediabetes: Secondary | ICD-10-CM | POA: Diagnosis not present

## 2019-10-30 DIAGNOSIS — D500 Iron deficiency anemia secondary to blood loss (chronic): Secondary | ICD-10-CM | POA: Diagnosis not present

## 2019-10-30 DIAGNOSIS — E782 Mixed hyperlipidemia: Secondary | ICD-10-CM | POA: Diagnosis not present

## 2019-10-30 DIAGNOSIS — N951 Menopausal and female climacteric states: Secondary | ICD-10-CM | POA: Diagnosis not present

## 2019-10-30 DIAGNOSIS — Z6841 Body Mass Index (BMI) 40.0 and over, adult: Secondary | ICD-10-CM | POA: Diagnosis not present

## 2019-11-03 DIAGNOSIS — N951 Menopausal and female climacteric states: Secondary | ICD-10-CM | POA: Diagnosis not present

## 2019-11-03 DIAGNOSIS — R6882 Decreased libido: Secondary | ICD-10-CM | POA: Diagnosis not present

## 2019-11-03 DIAGNOSIS — Z1339 Encounter for screening examination for other mental health and behavioral disorders: Secondary | ICD-10-CM | POA: Diagnosis not present

## 2019-11-03 DIAGNOSIS — E782 Mixed hyperlipidemia: Secondary | ICD-10-CM | POA: Diagnosis not present

## 2019-11-03 DIAGNOSIS — Z1331 Encounter for screening for depression: Secondary | ICD-10-CM | POA: Diagnosis not present

## 2019-11-10 DIAGNOSIS — E782 Mixed hyperlipidemia: Secondary | ICD-10-CM | POA: Diagnosis not present

## 2019-11-10 DIAGNOSIS — Z6841 Body Mass Index (BMI) 40.0 and over, adult: Secondary | ICD-10-CM | POA: Diagnosis not present

## 2019-12-04 DIAGNOSIS — Z6841 Body Mass Index (BMI) 40.0 and over, adult: Secondary | ICD-10-CM | POA: Diagnosis not present

## 2019-12-04 DIAGNOSIS — D500 Iron deficiency anemia secondary to blood loss (chronic): Secondary | ICD-10-CM | POA: Diagnosis not present

## 2019-12-04 DIAGNOSIS — E782 Mixed hyperlipidemia: Secondary | ICD-10-CM | POA: Diagnosis not present

## 2019-12-18 DIAGNOSIS — E782 Mixed hyperlipidemia: Secondary | ICD-10-CM | POA: Diagnosis not present

## 2019-12-18 DIAGNOSIS — Z6841 Body Mass Index (BMI) 40.0 and over, adult: Secondary | ICD-10-CM | POA: Diagnosis not present

## 2019-12-29 ENCOUNTER — Other Ambulatory Visit: Payer: Self-pay

## 2019-12-29 ENCOUNTER — Encounter (HOSPITAL_COMMUNITY): Payer: Self-pay | Admitting: Emergency Medicine

## 2019-12-29 ENCOUNTER — Emergency Department (HOSPITAL_COMMUNITY)
Admission: EM | Admit: 2019-12-29 | Discharge: 2019-12-30 | Disposition: A | Discharge status: Home / Self Care | Payer: BC Managed Care – PPO | Attending: Emergency Medicine | Admitting: Emergency Medicine

## 2019-12-29 DIAGNOSIS — R221 Localized swelling, mass and lump, neck: Secondary | ICD-10-CM | POA: Diagnosis not present

## 2019-12-29 DIAGNOSIS — L299 Pruritus, unspecified: Secondary | ICD-10-CM | POA: Insufficient documentation

## 2019-12-29 DIAGNOSIS — T7840XA Allergy, unspecified, initial encounter: Secondary | ICD-10-CM | POA: Insufficient documentation

## 2019-12-29 NOTE — ED Triage Notes (Signed)
Pt st;s she was asleep and woke up with the feeling that her throat was itching.  Pt st's she drank some water but feels like her throat is swollen.  Pt speaking in full sentences  No resp distress noted

## 2019-12-30 MED ORDER — METHYLPREDNISOLONE SODIUM SUCC 125 MG IJ SOLR
125.0000 mg | Freq: Once | INTRAMUSCULAR | Status: AC
  Administered 2019-12-30: 125 mg via INTRAVENOUS
  Filled 2019-12-30: qty 2

## 2019-12-30 MED ORDER — PREDNISONE 20 MG PO TABS: 40.0000 mg | ORAL_TABLET | Freq: Every day | ORAL | 0 refills | Status: DC

## 2019-12-30 MED ORDER — DIPHENHYDRAMINE HCL 50 MG/ML IJ SOLN
25.0000 mg | Freq: Once | INTRAMUSCULAR | Status: AC
  Administered 2019-12-30: 25 mg via INTRAVENOUS
  Filled 2019-12-30: qty 1

## 2019-12-30 NOTE — ED Provider Notes (Signed)
[facility] EMERGENCY DEPARTMENT Provider Note   CSN: [id number] Arrival date & time: 12/29/19  2257   History Chief Complaint  Patient presents with  . Possible Allergic Reaction    Kathryn Combs is a 49 y.o. female.  The history is provided by the patient.    History reviewed. No pertinent past medical history.  There are no problems to display for this patient. She woke up with a sensation that the left side of her throat was itchy and swollen.  She is having some difficulty swallowing but no difficulty breathing.  She had a similar episode like this once before and it was felt to be due to allergies to cherries.  Patient denies any unusual food exposures and denies any new medications or other exposures.  Symptoms have been stable.  She has not treated this with anything at home.  Past Surgical History:  Procedure Laterality Date  . CESAREAN SECTION       OB History   No obstetric history on file.     Family History  Problem Relation Age of Onset  . Cancer Mother   . Cancer Father   . Hypertension Sister   . Hypertension Brother     Social History   Tobacco Use  . Smoking status: Never Smoker  . Smokeless tobacco: Never Used  Vaping Use  . Vaping Use: Never used  Substance Use Topics  . Alcohol use: No  . Drug use: No    Home Medications Prior to Admission medications   Medication Sig Start Date End Date Taking? Authorizing Provider  cephALEXin (KEFLEX) 500 MG capsule Take 1 capsule (500 mg total) by mouth 3 (three) times daily. 09/04/15   Rolland Porter, MD  clindamycin (CLEOCIN) 150 MG capsule Take 1 capsule (150 mg total) by mouth 2 (two) times daily. 06/25/18   Park Liter, DPM  EPINEPHrine 0.3 mg/0.3 mL IJ SOAJ injection Inject 0.3 mLs (0.3 mg total) into the muscle once. 08/27/14   Gwyneth Sprout, MD  indomethacin (INDOCIN) 25 MG capsule Take 1 capsule (25 mg total) by mouth 3 (three) times daily as needed for mild pain or  moderate pain. 06/19/14   Presson, Mathis Fare, PA  naproxen (NAPROSYN) 500 MG tablet Take 1 tablet (500 mg total) by mouth 2 (two) times daily. 04/13/14   Arthor Captain, PA-C  oxyCODONE-acetaminophen (PERCOCET) 5-325 MG tablet Take 1 tablet by mouth every 4 (four) hours as needed for severe pain. 06/25/18   Park Liter, DPM  predniSONE (DELTASONE) 20 MG tablet Take 2 tablets (40 mg total) by mouth daily. 08/27/14   Gwyneth Sprout, MD    Allergies    Penicillins  Review of Systems   Review of Systems  All other systems reviewed and are negative.   Physical Exam Updated Vital Signs BP 128/60 (BP Location: Left Arm)   Pulse 63   Temp (!) 97.5 F (36.4 C) (Oral)   Resp 18   Ht 5\' 7"  (1.702 m)   Wt 120.2 kg   SpO2 99%   BMI 41.50 kg/m   Physical Exam Vitals and nursing note reviewed.   49 year old female, resting comfortably and in no acute distress. Vital signs are normal. Oxygen saturation is 99%, which is normal. Head is normocephalic and atraumatic. PERRLA, EOMI. Oropharynx is clear -no visible erythema or swelling.  There is no pooling of secretions.  Phonation is normal. Neck is nontender and supple without adenopathy or JVD. Back is  nontender and there is no CVA tenderness. Lungs are clear without rales, wheezes, or rhonchi. Chest is nontender. Heart has regular rate and rhythm without murmur. Abdomen is soft, flat, nontender without masses or hepatosplenomegaly and peristalsis is normoactive. Extremities have no cyanosis or edema, full range of motion is present. Skin is warm and dry without rash. Neurologic: Mental status is normal, cranial nerves are intact, there are no motor or sensory deficits.  ED Results / Procedures / Treatments   Labs (all labs ordered are listed, but only abnormal results are displayed) Labs Reviewed - No data to display  EKG None  Radiology No results found.  Procedures Procedures   Medications Ordered in ED Medications    diphenhydrAMINE (BENADRYL) injection 25 mg (has no administration in time range)  methylPREDNISolone sodium succinate (SOLU-MEDROL) 125 mg/2 mL injection 125 mg (has no administration in time range)    ED Course  I have reviewed the triage vital signs and the nursing notes.  Pertinent labs & imaging results that were available during my care of the patient were reviewed by me and considered in my medical decision making (see chart for details).  MDM Rules/Calculators/A&P Possible allergic reaction.  No visible signs of allergy on exam, but since airway is involved, will treat empirically and will need to observe in the ED.  She is given a dose of diphenhydramine and methylprednisolone.  Old records are reviewed, and she did have an ED visit in 2016 with similar presentation.  She was observed in the emergency department with no progression of her symptoms.  On reevaluation, she states she felt much better.  She is discharged with a prescription for prednisone.  Final Clinical Impression(s) / ED Diagnoses Final diagnoses:  Allergic reaction, initial encounter    Rx / DC Orders ED Discharge Orders    None       Dione Booze, MD 12/30/19 [id number]

## 2019-12-30 NOTE — ED Notes (Signed)
Pt states she woke up with her throat feelings scratchy and swollen. The last time she felt this way she found out she was allergic to cherries. Pt states she had some walnuts earlier in the day and her throat began to feel a little scratchy then but went away so she thought nothing of it.

## 2019-12-30 NOTE — Discharge Instructions (Addendum)
Take diphenhydramine, cetirizine, or loratadine as needed.  Consider asking your primary care provider for a referral to see an allergist.

## 2020-01-29 DIAGNOSIS — I10 Essential (primary) hypertension: Secondary | ICD-10-CM | POA: Diagnosis not present

## 2020-01-29 DIAGNOSIS — Z23 Encounter for immunization: Secondary | ICD-10-CM | POA: Diagnosis not present

## 2020-01-29 DIAGNOSIS — R7301 Impaired fasting glucose: Secondary | ICD-10-CM | POA: Diagnosis not present

## 2020-01-29 DIAGNOSIS — T7805XA Anaphylactic reaction due to tree nuts and seeds, initial encounter: Secondary | ICD-10-CM | POA: Diagnosis not present

## 2020-03-03 DIAGNOSIS — M791 Myalgia, unspecified site: Secondary | ICD-10-CM | POA: Diagnosis not present

## 2020-03-03 DIAGNOSIS — G43001 Migraine without aura, not intractable, with status migrainosus: Secondary | ICD-10-CM | POA: Diagnosis not present

## 2020-03-03 DIAGNOSIS — Z20822 Contact with and (suspected) exposure to covid-19: Secondary | ICD-10-CM | POA: Diagnosis not present

## 2020-04-05 ENCOUNTER — Encounter: Payer: Self-pay | Admitting: Allergy & Immunology

## 2020-04-05 ENCOUNTER — Ambulatory Visit (INDEPENDENT_AMBULATORY_CARE_PROVIDER_SITE_OTHER): Payer: BC Managed Care – PPO | Admitting: Allergy & Immunology

## 2020-04-05 ENCOUNTER — Other Ambulatory Visit: Payer: Self-pay

## 2020-04-05 VITALS — BP 124/88 | HR 65 | Temp 98.1°F | Ht 67.0 in | Wt 258.6 lb

## 2020-04-05 DIAGNOSIS — T7840XD Allergy, unspecified, subsequent encounter: Secondary | ICD-10-CM | POA: Diagnosis not present

## 2020-04-05 DIAGNOSIS — T7800XA Anaphylactic reaction due to unspecified food, initial encounter: Secondary | ICD-10-CM

## 2020-04-05 DIAGNOSIS — R21 Rash and other nonspecific skin eruption: Secondary | ICD-10-CM

## 2020-04-05 DIAGNOSIS — R070 Pain in throat: Secondary | ICD-10-CM

## 2020-04-05 DIAGNOSIS — T7800XD Anaphylactic reaction due to unspecified food, subsequent encounter: Secondary | ICD-10-CM | POA: Diagnosis not present

## 2020-04-05 DIAGNOSIS — T7840XA Allergy, unspecified, initial encounter: Secondary | ICD-10-CM

## 2020-04-05 NOTE — Progress Notes (Signed)
NEW PATIENT  Date of Service/Encounter:  04/05/20  Referring provider: Renaye Rakers, MD   Assessment:   Allergic reaction - unknown trigger  Anaphylactic shock due to food - with negative testing today  Plan/Recommendations:   1. Allergic reaction - unknown trigger - Testing was negative to all of the foods tested. - We are drawing blood work to confirm. - We are also getting a serum tryptase to rule out mast cell disorders. - We are going to get an alpha gal panel to rule out a red meat allergy. - We will call you in 1-2 weeks with the results of the testing. - EpiPen training reviewed.  - Anaphylaxis management plan provided. - Note any triggers of future reactions.    2. Return in about 6 months (around 10/03/2020).   Subjective:   Kathryn Combs is a 50 y.o. female presenting today for evaluation of  Chief Complaint  Patient presents with  . Allergic Reaction    Nov. Had scratchy throat and later that night realized her throat was closing     Kathryn Combs has a history of the following: There are no problems to display for this patient.   History obtained from: chart review and patient.  Kathryn Combs was referred by Renaye Rakers, MD.     Kathryn Combs is a 50 y.o. female presenting for an evaluation of an allergic reaction.   She had eaten walnuts in her yogurt and peanut butter in her yogurt. This was in the morning. She does not recall exactly what she had for lunch. She had maybe some chicken and greens. She ate a salad for dinner and popcorn later that night. Something did not "mix right". She went to bed and was feeling fine. She went to bed around 10pm with a sensation that her throat was closing.   She was seen in the ED in November 2021 for evaluation of an allergic reaction. She was given Benadryl as well as solumedrol. She was monitored for a couple of hours and she felt completely back to normal. She is unsure of the trigger.   She is very  nervous about her foods that she is eating. She was drinking orange juice and she had bumps on her tongue. She took some Benadryl and it recovered.   She knows that she has reactions to tomatoes (rash), cherries (throat closure two years ago), and oranges (tongue swelling). She eats wheat, eggs, and seafood. She ate a Reese's cup and she had tingling. She does consume dairy and drinks milk with cereal (Lactaid). She has avoided any corn products.  She does have eczema and tries to not stay in the water too long. She does moisturize with shea butter. This seems to work well. She also added some almond oil.   Otherwise, there is no history of other atopic diseases, including asthma, drug allergies, environmental allergies, stinging insect allergies, urticaria or contact dermatitis. There is no significant infectious history. Vaccinations are up to date.    Past Medical History: There are no problems to display for this patient.   Medication List:  Allergies as of 04/05/2020      Reactions   Penicillins       Medication List       Accurate as of April 05, 2020  1:09 PM. If you have any questions, ask your nurse or doctor.        STOP taking these medications   indomethacin 25 MG capsule Commonly known as:  INDOCIN Stopped by: Alfonse Spruce, MD   predniSONE 20 MG tablet Commonly known as: DELTASONE Stopped by: Alfonse Spruce, MD     TAKE these medications   EPINEPHrine 0.3 mg/0.3 mL Soaj injection Commonly known as: EPI-PEN Inject 0.3 mLs (0.3 mg total) into the muscle once.   naproxen 500 MG tablet Commonly known as: NAPROSYN Take 1 tablet (500 mg total) by mouth 2 (two) times daily.       Birth History: non-contributory  Developmental History: non-contributory  Past Surgical History: Past Surgical History:  Procedure Laterality Date  . CESAREAN SECTION       Family History: Family History  Problem Relation Age of Onset  . Cancer Mother   .  Cancer Father   . Hypertension Sister   . Hypertension Brother      Social History: Kalia lives at home with her family.  She lives in a house that is 49 years old.  There is carpeting throughout the home.  She has electric heating and central cooling.  There is a dog inside of the home.  There are no dust mite covers on the bedding.  There is no tobacco exposure.  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  She is exposed to fumes, chemicals, or dust in her environment.  She does not use a HEPA filter.  She does not live near an interstate or industrial area.  She does not smoke tobacco.   Review of Systems  Constitutional: Negative.  Negative for chills, fever, malaise/fatigue and weight loss.  HENT: Negative.  Negative for congestion, ear discharge, ear pain and sore throat.   Eyes: Negative for pain, discharge and redness.  Respiratory: Negative for cough, sputum production, shortness of breath and wheezing.   Cardiovascular: Negative.  Negative for chest pain and palpitations.  Gastrointestinal: Negative for abdominal pain, constipation, diarrhea, heartburn, nausea and vomiting.  Skin: Negative.  Negative for itching and rash.  Neurological: Negative for dizziness and headaches.  Endo/Heme/Allergies: Negative for environmental allergies. Does not bruise/bleed easily.       Objective:   Blood pressure 124/88, pulse 65, temperature 98.1 F (36.7 C), temperature source Temporal, height 5\' 7"  (1.702 m), weight 258 lb 9.6 oz (117.3 kg), SpO2 98 %. Body mass index is 40.5 kg/m.   Physical Exam:   Physical Exam Constitutional:      Appearance: She is well-developed.     Comments: Very pleasant female.  Cooperative.  HENT:     Head: Normocephalic and atraumatic.     Right Ear: Tympanic membrane, ear canal and external ear normal. No drainage, swelling or  tenderness. Tympanic membrane is not injected, scarred, erythematous, retracted or bulging.     Left Ear: Tympanic membrane, ear canal and external ear normal. No drainage, swelling or tenderness. Tympanic membrane is not injected, scarred, erythematous, retracted or bulging.     Nose: No nasal deformity, septal deviation, mucosal edema, rhinorrhea or epistaxis.     Right Turbinates: Enlarged and swollen.     Left Turbinates: Enlarged and swollen.     Right Sinus: No maxillary sinus tenderness or frontal sinus tenderness.     Left Sinus: No maxillary sinus tenderness or frontal sinus tenderness.     Mouth/Throat:     Mouth: Oropharynx is clear and moist. Mucous membranes are not pale and not dry.  Pharynx: Uvula midline.  Eyes:     General:        Right eye: No discharge.        Left eye: No discharge.     Extraocular Movements: EOM normal.     Conjunctiva/sclera: Conjunctivae normal.     Right eye: Right conjunctiva is not injected. No chemosis.    Left eye: Left conjunctiva is not injected. No chemosis.    Pupils: Pupils are equal, round, and reactive to light.  Cardiovascular:     Rate and Rhythm: Normal rate and regular rhythm.     Heart sounds: Normal heart sounds.  Pulmonary:     Effort: Pulmonary effort is normal. No tachypnea, accessory muscle usage or respiratory distress.     Breath sounds: Normal breath sounds. No wheezing, rhonchi or rales.     Comments: Moving air well in all lung fields. Chest:     Chest wall: No tenderness.  Abdominal:     Tenderness: There is no abdominal tenderness. There is no guarding or rebound.  Lymphadenopathy:     Head:     Right side of head: No submandibular, tonsillar or occipital adenopathy.     Left side of head: No submandibular, tonsillar or occipital adenopathy.     Cervical: No cervical adenopathy.  Skin:    General: Skin is warm.     Capillary Refill: Capillary refill takes less than 2 seconds.     Coloration: Skin is not  pale.     Findings: No abrasion, erythema, petechiae or rash. Rash is not papular, urticarial or vesicular.     Comments: No urticarial or eczematous lesions noted.  Neurological:     Mental Status: She is alert.  Psychiatric:        Mood and Affect: Mood and affect normal.        Behavior: Behavior is cooperative.      Diagnostic studies:   Allergy Studies:     Food Adult Perc - 04/05/20 0900    Time Antigen Placed 0945    Allergen Manufacturer Waynette Buttery    Location Back    Number of allergen test 16    1. Peanut Negative    10. Cashew Negative    11. Pecan Food Negative    12. Walnut Food Negative    13. Almond Negative    14. Hazelnut Negative    15. [place] nut Negative    16. Coconut Negative    17. Pistachio Negative    42. Tomato Negative    51. Carrots Negative    53. Corn Negative    56. Orange  Negative    63. Pineapple Negative    Comments Positive control: 2+, Negative control: NEG           Allergy testing results were read and interpreted by myself, documented by clinical staff.         Malachi Bonds, MD [facility]

## 2020-04-05 NOTE — Patient Instructions (Addendum)
1. Allergic reaction - unknown trigger - Testing was negative to all of the foods tested. - We are drawing blood work to confirm. - We are also getting a serum tryptase to rule out mast cell disorders. - We are going to get an alpha gal panel to rule out a red meat allergy. - We will call you in 1-2 weeks with the results of the testing. - EpiPen training reviewed.  - Anaphylaxis management plan provided. - Note any triggers of future reactions.    2. Return in about 6 months (around 10/03/2020).    Please inform us of any Emergency Department visits, hospitalizations, or changes in symptoms. Call us before going to the ED for breathing or allergy symptoms since we might be able to fit you in for a sick visit. Feel free to contact us anytime with any questions, problems, or concerns.  It was a pleasure to meet you today!  Websites that have reliable patient information: 1. American Academy of Asthma, Allergy, and Immunology: www.aaaai.org 2. Food Allergy Research and Education (FARE): foodallergy.org 3. Mothers of Asthmatics: http://www.asthmacommunitynetwork.org 4. American College of Allergy, Asthma, and Immunology: www.acaai.org   COVID-19 Vaccine Information can be found at: PodExchange.[email or web address] For questions related to vaccine distribution or appointments, please email [email or web address] or call [phone].   We realize that you might be concerned about having an allergic reaction to the COVID19 vaccines. To help with that concern, WE ARE OFFERING THE COVID19 VACCINES IN OUR OFFICE! Ask the front desk for dates!     "Like" [place] on Facebook and Instagram for our latest updates!      A health democracy works best when [employer] participate! Make sure you are registered to vote! If you have moved or changed any of your contact information, you will need to get this updated before voting!  In some cases, you MAY be able  to register to vote online: [email or web address]      Food Adult Perc - 04/05/20 0900    1. Peanut Negative    10. Cashew Negative    11. Pecan Food Negative    12. Walnut Food Negative    13. Almond Negative    14. Hazelnut Negative    15. [place] nut Negative    16. Coconut Negative    17. Pistachio Negative    42. Tomato Negative    51. Carrots Negative    53. Corn Negative    56. Orange  Negative    63. Pineapple Negative    Comments Positive control: 2+, Negative control: NEG

## 2020-04-06 LAB — ALLERGEN CARROT

## 2020-04-06 LAB — ALLERGEN, TOMATO F25

## 2020-04-06 LAB — ALLERGEN, CORN F8

## 2020-04-06 LAB — ALLERGY PANEL 18, NUT MIX GROUP

## 2020-04-09 LAB — ALLERGY PANEL 18, NUT MIX GROUP
F020-IgE Almond: <0.1 kU/L
F202-IgE Cashew Nut: <0.1 kU/L
Hazelnut (Filbert) IgE: <0.1 kU/L
Peanut IgE: <0.1 kU/L
Pecan Nut IgE: <0.1 kU/L
Sesame Seed IgE: <0.1 kU/L

## 2020-04-09 LAB — ALPHA-GAL PANEL
Allergen Lamb IgE: <0.1 kU/L
Beef IgE: <0.1 kU/L
IgE (Immunoglobulin E), Serum: 12 IU/mL (ref 6–495)
O215-IgE Alpha-Gal: <0.1 kU/L
Pork IgE: <0.1 kU/L

## 2020-04-09 LAB — ALLERGEN, BRAZIL NUT, F18: Brazil Nut IgE: <0.1 kU/L

## 2020-04-09 LAB — ALLERGEN, ORANGE F33: Orange: <0.1 kU/L

## 2020-04-09 LAB — ALLERGEN WALNUT F256: Walnut IgE: <0.1 kU/L

## 2020-04-09 LAB — TRYPTASE: Tryptase: 5.8 ug/L (ref 2.2–13.2)

## 2020-04-09 LAB — ALLERGEN, PINEAPPLE, F210: Pineapple IgE: <0.1 kU/L

## 2020-07-15 ENCOUNTER — Ambulatory Visit
Admission: EM | Admit: 2020-07-15 | Discharge: 2020-07-15 | Disposition: A | Discharge status: Home / Self Care | Payer: BC Managed Care – PPO | Attending: Emergency Medicine | Admitting: Emergency Medicine

## 2020-07-15 ENCOUNTER — Other Ambulatory Visit: Payer: Self-pay

## 2020-07-15 DIAGNOSIS — M546 Pain in thoracic spine: Secondary | ICD-10-CM

## 2020-07-15 DIAGNOSIS — M545 Low back pain, unspecified: Secondary | ICD-10-CM

## 2020-07-15 DIAGNOSIS — S161XXA Strain of muscle, fascia and tendon at neck level, initial encounter: Secondary | ICD-10-CM

## 2020-07-15 MED ORDER — TIZANIDINE HCL 2 MG PO TABS: 2.0000 mg | ORAL_TABLET | Freq: Four times a day (QID) | ORAL | 0 refills | Status: AC | PRN

## 2020-07-15 MED ORDER — IBUPROFEN 800 MG PO TABS: 800.0000 mg | ORAL_TABLET | Freq: Three times a day (TID) | ORAL | 0 refills | Status: AC
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
## 2020-07-15 NOTE — Discharge Instructions (Signed)
-   Use anti-inflammatories for pain/swelling. You may take up to 800 mg Ibuprofen every 8 hours with food. You may supplement Ibuprofen with Tylenol [phone] mg every 8 hours.  -You may use tizanidine as needed to help with pain. This is a muscle relaxer and causes sedation- please use only at bedtime or when you will be home and not have to drive/work - Gentle stretching, alternate ice and heat - Follow up if not improving or worsening
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
## 2021-01-23 ENCOUNTER — Emergency Department (HOSPITAL_COMMUNITY)
Admission: EM | Admit: 2021-01-23 | Discharge: 2021-01-24 | Disposition: A | Discharge status: Home / Self Care | Payer: BC Managed Care – PPO | Attending: Emergency Medicine | Admitting: Emergency Medicine

## 2021-01-23 ENCOUNTER — Encounter (HOSPITAL_COMMUNITY): Payer: Self-pay | Admitting: Emergency Medicine

## 2021-01-23 ENCOUNTER — Other Ambulatory Visit: Payer: Self-pay

## 2021-01-23 DIAGNOSIS — L299 Pruritus, unspecified: Secondary | ICD-10-CM | POA: Insufficient documentation

## 2021-01-23 DIAGNOSIS — R0600 Dyspnea, unspecified: Secondary | ICD-10-CM | POA: Diagnosis not present

## 2021-01-23 DIAGNOSIS — T7840XA Allergy, unspecified, initial encounter: Secondary | ICD-10-CM

## 2021-01-23 MED ORDER — DEXAMETHASONE SODIUM PHOSPHATE 10 MG/ML IJ SOLN
10.0000 mg | Freq: Once | INTRAMUSCULAR | Status: AC
  Administered 2021-01-23: 10 mg via INTRAVENOUS
  Filled 2021-01-23: qty 1

## 2021-01-23 MED ORDER — METHYLPREDNISOLONE SODIUM SUCC 125 MG IJ SOLR
125.0000 mg | Freq: Once | INTRAMUSCULAR | Status: DC
Start: 2021-01-23 — End: 2021-01-23

## 2021-01-23 MED ORDER — FAMOTIDINE IN NACL 20-0.9 MG/50ML-% IV SOLN
20.0000 mg | Freq: Once | INTRAVENOUS | Status: AC
Start: 2021-01-23 — End: 2021-01-24
  Administered 2021-01-23: 20 mg via INTRAVENOUS
  Filled 2021-01-23: qty 50

## 2021-01-23 MED ORDER — DIPHENHYDRAMINE HCL 50 MG/ML IJ SOLN
25.0000 mg | Freq: Once | INTRAMUSCULAR | Status: AC
  Administered 2021-01-23: 25 mg via INTRAVENOUS
  Filled 2021-01-23: qty 1

## 2021-01-23 MED ORDER — SODIUM CHLORIDE 0.9 % IV BOLUS
1000.0000 mL | Freq: Once | INTRAVENOUS | Status: AC
  Administered 2021-01-23: 1000 mL via INTRAVENOUS

## 2021-01-23 MED ORDER — EPINEPHRINE 0.3 MG/0.3ML IJ SOAJ
0.3000 mg | Freq: Once | INTRAMUSCULAR | Status: DC
Start: 2021-01-23 — End: 2021-01-23

## 2021-01-23 NOTE — ED Provider Notes (Signed)
Emergency Medicine Provider Triage Evaluation Note  Kathryn Combs , a 50 y.o. female  was evaluated in triage.  Pt complains of allergic reaction. Reports that shortly PTA she developed sensation of her throat closing with itching and trouble breathing, took 25 mg of benadryl @ home PTA. She feels like her symptoms are getting worse.  This is similar to when she found out she was allergic to cherries. She does not know of any known new exposures, states she was eating an oreo.   Review of Systems  Positive: Dysphagia, dyspnea, pruritus Negative: Vomiting, syncope  Physical Exam  BP 124/89   Pulse 74   Temp 97.7 F (36.5 C) (Oral)   Resp 16   SpO2 100%  Gen:   Awake, no distress   Resp:  Normal effort  MSK:   Moves extremities without difficulty  Other:  No angioedema noted. Tolerating own secretions. Posterior oropharynx is fairly unremarkable. No urticaria.   Medical Decision Making  Medically screening exam initiated at 10:59 PM.  Appropriate orders placed.  JAMELAH SITZER was informed that the remainder of the evaluation will be completed by another provider, this initial triage assessment does not replace that evaluation, and the importance of remaining in the ED until their evaluation is complete.  Patient presents with sensation of throat closing/dyspnea-  Patient given Benadryl, steroids and Pepcid. Will observe in triage, holding off on epinephrine at this time given no stridor, wheezing or evidence of uvular or pharyngeal edema on exam. Tongue is normal.. She has been seen in the ED twice for similar and has not required epi w/ similar presentations.     Desmond Lope 01/24/21 [id number]    Shon Baton, MD 01/24/21 [phone]

## 2021-01-23 NOTE — ED Triage Notes (Signed)
Pt presents to ED POV. Pt c/o swelling in throat began around 2200. Pt unknown allergen contact. Airway intact as of now.

## 2021-01-24 DIAGNOSIS — T7840XA Allergy, unspecified, initial encounter: Secondary | ICD-10-CM | POA: Diagnosis not present

## 2021-01-24 MED ORDER — PREDNISONE 10 MG PO TABS: 40.0000 mg | ORAL_TABLET | Freq: Every day | ORAL | 0 refills | Status: AC

## 2021-01-24 MED ORDER — EPINEPHRINE 0.3 MG/0.3ML IJ SOAJ: 0.3000 mg | INTRAMUSCULAR | 1 refills | Status: AC | PRN

## 2021-01-24 NOTE — ED Provider Notes (Signed)
[facility] EMERGENCY DEPARTMENT Provider Note   CSN: [id number] Arrival date & time: 01/23/21  2233     History Chief Complaint  Patient presents with   Allergic Reaction    Kathryn Combs is a 50 y.o. female with a hx of eczema who presents to the ED with concern for allergic reaction that began shortly PTA. Patient reports shortly after eating an oreo cookie which she has had many times before she developed sensation of throat closing & dyspnea with itchiness. She took 25 mg of benadryl without much change. Sxs similar to when she has had prior allergic reactions and come to the ED. No known new exposures she can think of. She denies syncope, nausea, vomiting, rash, or lip swelling.   HPI     Past Medical History:  Diagnosis Date   Eczema     There are no problems to display for this patient.   Past Surgical History:  Procedure Laterality Date   CESAREAN SECTION       OB History   No obstetric history on file.     Family History  Problem Relation Age of Onset   Cancer Mother    Cancer Father    Hypertension Sister    Hypertension Brother     Social History   Tobacco Use   Smoking status: Never   Smokeless tobacco: Never  Vaping Use   Vaping Use: Never used  Substance Use Topics   Alcohol use: No   Drug use: No    Home Medications Prior to Admission medications   Medication Sig Start Date End Date Taking? Authorizing Provider  EPINEPHrine 0.3 mg/0.3 mL IJ SOAJ injection Inject 0.3 mLs (0.3 mg total) into the muscle once. 08/27/14   Gwyneth Sprout, MD  ibuprofen (ADVIL) 800 MG tablet Take 1 tablet (800 mg total) by mouth 3 (three) times daily. 07/15/20   Wieters, Hallie C, PA-C  tiZANidine (ZANAFLEX) 2 MG tablet Take 1-2 tablets (2-4 mg total) by mouth every 6 (six) hours as needed for muscle spasms. 07/15/20   Wieters, Hallie C, PA-C    Allergies    Penicillins  Review of Systems   Review of Systems  Constitutional:  Negative  for chills and fever.  HENT:  Positive for trouble swallowing.   Respiratory:  Positive for shortness of breath.   Cardiovascular:  Negative for chest pain.  Gastrointestinal:  Negative for abdominal pain, nausea and vomiting.  Skin:  Negative for rash.  Neurological:  Negative for syncope.  All other systems reviewed and are negative.  Physical Exam Updated Vital Signs BP 124/89   Pulse 74   Temp 97.7 F (36.5 C) (Oral)   Resp 16   SpO2 100%   Physical Exam Vitals and nursing note reviewed.  Constitutional:      General: She is not in acute distress.    Appearance: She is well-developed. She is not toxic-appearing.  HENT:     Head: Normocephalic and atraumatic.     Mouth/Throat:     Mouth: No angioedema.     Pharynx: Oropharynx is clear. Uvula midline. No pharyngeal swelling, oropharyngeal exudate, posterior oropharyngeal erythema or uvula swelling.     Comments: Posterior oropharynx is symmetric appearing. Patient tolerating own secretions without difficulty. No trismus. No drooling. No hot potato voice. No swelling beneath the tongue, submandibular compartment is soft. Airway is patent.  Eyes:     General:        Right eye: No discharge.  Left eye: No discharge.     Conjunctiva/sclera: Conjunctivae normal.  Cardiovascular:     Rate and Rhythm: Normal rate and regular rhythm.  Pulmonary:     Effort: Pulmonary effort is normal. No respiratory distress.     Breath sounds: Normal breath sounds. No stridor. No wheezing, rhonchi or rales.  Abdominal:     General: There is no distension.     Palpations: Abdomen is soft.     Tenderness: There is no abdominal tenderness.  Musculoskeletal:     Cervical back: Neck supple.  Skin:    General: Skin is warm and dry.     Findings: No rash.  Neurological:     Mental Status: She is alert.     Comments: Clear speech.   Psychiatric:        Behavior: Behavior normal.    ED Results / Procedures / Treatments   Labs (all  labs ordered are listed, but only abnormal results are displayed) Labs Reviewed  I-STAT BETA HCG BLOOD, ED ([facility], [facility], [facility] ONLY)    EKG None  Radiology No results found.  Procedures Procedures   Medications Ordered in ED Medications  famotidine (PEPCID) IVPB 20 mg premix (20 mg Intravenous New Bag/Given 01/23/21 2354)  diphenhydrAMINE (BENADRYL) injection 25 mg (25 mg Intravenous Given 01/23/21 2324)  dexamethasone (DECADRON) injection 10 mg (10 mg Intravenous Given 01/23/21 2324)  sodium chloride 0.9 % bolus 1,000 mL (1,000 mLs Intravenous New Bag/Given 01/23/21 2326)    ED Course  I have reviewed the triage vital signs and the nursing notes.  Pertinent labs & imaging results that were available during my care of the patient were reviewed by me and considered in my medical decision making (see chart for details).    MDM Rules/Calculators/A&P                           Patient presents with sensation of throat closing/dyspnea that began shortly PTA. Concern for allergic reaction. Nontoxic, resting comfortably, vitals WNL. Patient given Benadryl, steroids and Pepcid with plan for observation. Holding off on epinephrine at this time given no stridor, wheezing or evidence of uvular or pharyngeal edema on exam. Tongue is normal. On chart review for additional hx she has been seen in the ED twice for similar and has not required epi w/ similar presentations. Preg test ordered & negative.    00:02: RE-EVAL: Patient feeling much better following medications, resting comfortably.   02:00: RE-EVAL: Patient remains feeling well, her sxs are completely resolved, she remains with an unremarkable exam- no angioedema, stridor, or increased work of breathing- airway is patent. Overall appears appropriate for discharge. Will provide short course of steroids as well as information for allergist to follow up with. We discussed strict return precautions. I discussed treatment plan, need for follow-up, and  return precautions with the patient. Provided opportunity for questions, patient confirmed understanding and is in agreement with plan.    Final Clinical Impression(s) / ED Diagnoses Final diagnoses:  Allergic reaction, initial encounter    Rx / DC Orders ED Discharge Orders          Ordered    predniSONE (DELTASONE) 10 MG tablet  Daily        01/24/21 0205    EPINEPHrine 0.3 mg/0.3 mL IJ SOAJ injection  As needed        01/24/21 0205             Tonny Isensee, Pleas Koch,  PA-C 01/24/21 0211    Shon Baton, MD 01/24/21 [phone]

## 2021-01-24 NOTE — Discharge Instructions (Signed)
You were seen in the Er today for an allergic reaction.  Take benadryl per over the counter dosing as needed at home for itching/swelling.  Take prednisone as prescribed- this is a steroid.   We have prescribed you new medication(s) today. Discuss the medications prescribed today with your pharmacist as they can have adverse effects and interactions with your other medicines including over the counter and prescribed medications. Seek medical evaluation if you start to experience new or abnormal symptoms after taking one of these medicines, seek care immediately if you start to experience difficulty breathing, feeling of your throat closing, facial swelling, or rash as these could be indications of a more serious allergic reaction  We have also send in an epi pen for you to use as needed as well- see attached handout. If you need to use the epi pen you will need to return to the emergency department. Additional return immediately for new or worsening symptoms including but not limited to new or worsening swelling, trouble breathing, facial swelling, tongue swelling/intra-oral swelling of any kind, vomiting, dizziness, passing out, chest pain or any other concerns.   Please follow up with primary care as soon as possible.  We have also provided information for an allergist to see in follow up.

## 2021-01-24 NOTE — ED Notes (Signed)
Discharge instructions discussed with pt. Pt verbalized understanding. Pt stable and ambulatory.  °

## 2021-01-26 LAB — I-STAT BETA HCG BLOOD, ED (MC, WL, AP ONLY): I-stat hCG, quantitative: <5 m[IU]/mL (ref <5)

## 2021-10-04 ENCOUNTER — Other Ambulatory Visit (HOSPITAL_COMMUNITY): Payer: Self-pay

## 2021-10-04 MED ORDER — OZEMPIC (1 MG/DOSE) 4 MG/3ML ~~LOC~~ SOPN
1.0000 mg | PEN_INJECTOR | SUBCUTANEOUS | 0 refills | Status: AC
  Filled 2021-10-04: qty 9, 84d supply, fill #0
  Filled 2021-10-13: qty 3, 28d supply, fill #0
  Filled 2021-11-03: qty 9, 84d supply, fill #0

## 2021-10-13 ENCOUNTER — Other Ambulatory Visit (HOSPITAL_COMMUNITY): Payer: Self-pay

## 2021-11-03 ENCOUNTER — Other Ambulatory Visit (HOSPITAL_COMMUNITY): Payer: Self-pay

## 2022-04-25 ENCOUNTER — Other Ambulatory Visit (HOSPITAL_COMMUNITY): Payer: Self-pay

## 2022-04-25 MED ORDER — CHOLECALCIFEROL 1.25 MG (50000 UT) PO CAPS
50000.0000 [IU] | ORAL_CAPSULE | ORAL | 3 refills | Status: AC
  Filled 2022-04-25: qty 12, 84d supply, fill #0

## 2022-05-15 ENCOUNTER — Other Ambulatory Visit (HOSPITAL_COMMUNITY): Payer: Self-pay

## 2022-09-12 ENCOUNTER — Other Ambulatory Visit: Payer: Self-pay | Admitting: Family Medicine

## 2022-09-12 ENCOUNTER — Ambulatory Visit
Admission: RE | Admit: 2022-09-12 | Discharge: 2022-09-12 | Disposition: A | Discharge status: Home / Self Care | Payer: BC Managed Care – PPO | Source: Ambulatory Visit | Attending: Family Medicine | Admitting: Family Medicine

## 2022-09-12 DIAGNOSIS — R519 Headache, unspecified: Secondary | ICD-10-CM

## 2022-09-12 DIAGNOSIS — R52 Pain, unspecified: Secondary | ICD-10-CM

## 2023-06-02 ENCOUNTER — Other Ambulatory Visit: Payer: Self-pay

## 2023-06-02 ENCOUNTER — Emergency Department (HOSPITAL_COMMUNITY)

## 2023-06-02 ENCOUNTER — Encounter (HOSPITAL_COMMUNITY): Payer: Self-pay

## 2023-06-02 ENCOUNTER — Emergency Department (HOSPITAL_COMMUNITY)
Admission: EM | Admit: 2023-06-02 | Discharge: 2023-06-02 | Disposition: A | Discharge status: Home / Self Care | Attending: Emergency Medicine | Admitting: Emergency Medicine

## 2023-06-02 DIAGNOSIS — D72829 Elevated white blood cell count, unspecified: Secondary | ICD-10-CM | POA: Diagnosis not present

## 2023-06-02 DIAGNOSIS — R079 Chest pain, unspecified: Secondary | ICD-10-CM

## 2023-06-02 DIAGNOSIS — R0789 Other chest pain: Secondary | ICD-10-CM | POA: Diagnosis not present

## 2023-06-02 LAB — CBC
HCT: 39.8 % (ref 36.0–46.0)
Hemoglobin: 12.6 g/dL (ref 12.0–15.0)
MCH: 26.3 pg (ref 26.0–34.0)
MCHC: 31.7 g/dL (ref 30.0–36.0)
MCV: 82.9 fL (ref 80.0–100.0)
Platelets: 227 10*3/uL (ref 150–400)
RBC: 4.8 MIL/uL (ref 3.87–5.11)
RDW: 14.5 % (ref 11.5–15.5)
WBC: 6.1 10*3/uL (ref 4.0–10.5)
nRBC: 0 % (ref 0.0–0.2)

## 2023-06-02 LAB — BASIC METABOLIC PANEL WITH GFR
Anion gap: 11 (ref 5–15)
BUN: 7 mg/dL (ref 6–20)
CO2: 25 mmol/L (ref 22–32)
Calcium: 9.2 mg/dL (ref 8.9–10.3)
Chloride: 102 mmol/L (ref 98–111)
Creatinine, Ser: 0.82 mg/dL (ref 0.44–1.00)
GFR, Estimated: >60 mL/min (ref >60)
Glucose, Bld: 92 mg/dL (ref 70–99)
Potassium: 3.6 mmol/L (ref 3.5–5.1)
Sodium: 138 mmol/L (ref 135–145)

## 2023-06-02 LAB — D-DIMER, QUANTITATIVE: D-Dimer, Quant: <0.27 ug{FEU}/mL (ref 0.00–0.50)

## 2023-06-02 LAB — TROPONIN I (HIGH SENSITIVITY)
Troponin I (High Sensitivity): 4 ng/L (ref <18)
Troponin I (High Sensitivity): 5 ng/L (ref <18)

## 2023-06-02 LAB — HCG, QUANTITATIVE, PREGNANCY: hCG, Beta Chain, Quant, S: 2 m[IU]/mL (ref <5)

## 2023-06-02 MED ORDER — CYCLOBENZAPRINE HCL 10 MG PO TABS
5.0000 mg | ORAL_TABLET | Freq: Once | ORAL | Status: AC
  Administered 2023-06-02: 5 mg via ORAL
  Filled 2023-06-02: qty 1

## 2023-06-02 MED ORDER — LIDOCAINE 5 % EX PTCH
1.0000 | MEDICATED_PATCH | CUTANEOUS | Status: DC
  Administered 2023-06-02: 1 via TRANSDERMAL
  Filled 2023-06-02: qty 1

## 2023-06-02 NOTE — Discharge Instructions (Signed)
 You were seen here for chest pain.  Your workup is reassuring against heart attack at this time.  You may have chest wall pain.  Take Tylenol and Motrin as needed. You can take robaxin, but do not drive or operate heavy machinery if you take this since it makes you sleepy. Please return for any emergency medical care.

## 2023-06-02 NOTE — ED Triage Notes (Signed)
 Reports sudden severe chest pain in left chest that started prior to coming.  Reports she was just praising jesus.  Patient is under a lot of stress with life stressors.  Worse with movement and deep breaths.  Denies pain radiating to any other part of body and endorses dizziness.

## 2023-06-02 NOTE — ED Provider Notes (Signed)
 Assume Care note Vitals  BP (!) 142/82 (BP Location: Right Arm)   Pulse (!) 59   Temp 97.8 F (36.6 C)   Resp 16   Ht 5\' 7"  (1.702 m)   Wt 117 kg   SpO2 100%   BMI 40.41 kg/m    ED Course / MDM   Clinical Course as of 06/02/23 1627  Sun Jun 02, 2023  1500 S- sudden onset CP at church, left lower chest, worse with inspiration and reproducible, bilat calf swelling, low risk HEART [ ]  follow-up second trop and ddimer Can dc if negative [AO]    Clinical Course User Index [AO] Lorain Robson, MD   Medical Decision Making Amount and/or Complexity of Data Reviewed Labs: ordered. Radiology: ordered.  Risk Prescription drug management.   I reassessed the patient after signout.  She states that her chest pain has improved but is not completely gone.  She endorses that it happened about 1 hour prior to arrival, reproducible to palpation.  Denies any history of prior heart attacks, hypertension, hyperlipidemia, notes prediabetes but is not diabetic.  No history of smoking.  I reviewed workup, which demonstrates unremarkable first troponin, EKG with no acute ischemic changes.  Currently pending second troponin and D-dimer. HEART score 1.  Second troponin resulted at 5, and D-dimer less than 0.27, reassuring against PE.  Overall, patient has been reassuring workup against ACS at this time.  Discussed pain management at home with Tylenol and ibuprofen as well as Robaxin as needed for muscle spasms.  Patient is aware she should not take this if she plans to drive as it can make her sleepy.  Discussed close outpatient follow-up with her PCP.  She is agreeable.  Strict return precautions were given, and patient was discharged in stable condition.  Patient seen in conjunction with Dr. Urban Garden, who agreed with the above work-up and plan of care.       Lorain Robson, MD 06/02/23 1627    Onetha Bile, MD 06/05/23 1459

## 2023-06-02 NOTE — ED Provider Notes (Signed)
 [place] EMERGENCY DEPARTMENT AT [facility] Provider Note   CSN: [id number] Arrival date & time: 06/02/23  1302     History  Chief Complaint  Patient presents with  . Chest Pain    Kathryn Combs is a 53 y.o. female.  Without significant past medical history is reporting to emergency room with complaint of chest pain.  Patient is having left sided chest pain that is severe.  Patient reports that chest pain is worse when moving side-to-side or with deep breaths in.  Patient reports this started approximately 2 hours ago when she was at church singing.  Chest pain came on quite suddenly.  Pain has become less severe once resting.  She had no associated shortness of breath, diaphoresis or nausea.  She has no history of coronary artery disease and has no hypertension or hyperlipidemia.   Chest Pain      Home Medications Prior to Admission medications   Medication Sig Start Date End Date Taking? Authorizing Provider  Cholecalciferol 1.25 MG (50000 UT) capsule Take 1 capsule (50,000 Units total) by mouth once a week. 04/25/22   Jonathon Neighbors, MD  EPINEPHrine 0.3 mg/0.3 mL IJ SOAJ injection Inject 0.3 mg into the muscle as needed for anaphylaxis. 01/24/21   Petrucelli, Samantha R, PA-C  ibuprofen (ADVIL) 800 MG tablet Take 1 tablet (800 mg total) by mouth 3 (three) times daily. 07/15/20   Wieters, Hallie C, PA-C  Semaglutide, 1 MG/DOSE, (OZEMPIC, 1 MG/DOSE,) 4 MG/3ML SOPN Inject 1 mg into the skin once a week. 10/04/21     tiZANidine (ZANAFLEX) 2 MG tablet Take 1-2 tablets (2-4 mg total) by mouth every 6 (six) hours as needed for muscle spasms. 07/15/20   Wieters, Hallie C, PA-C      Allergies    Penicillins    Review of Systems   Review of Systems  Cardiovascular:  Positive for chest pain.    Physical Exam Updated Vital Signs BP (!) 142/82 (BP Location: Right Arm)   Pulse (!) 59   Temp 97.8 F (36.6 C)   Resp 16   Ht 5\' 7"  (1.702 m)   Wt 117 kg   SpO2 100%   BMI  40.41 kg/m  Physical Exam Vitals and nursing note reviewed.  Constitutional:      General: She is not in acute distress.    Appearance: She is not toxic-appearing.  HENT:     Head: Normocephalic and atraumatic.  Eyes:     General: No scleral icterus.    Conjunctiva/sclera: Conjunctivae normal.  Cardiovascular:     Rate and Rhythm: Normal rate and regular rhythm.     Pulses: Normal pulses.     Heart sounds: Normal heart sounds.  Pulmonary:     Effort: Pulmonary effort is normal. No respiratory distress.     Breath sounds: Normal breath sounds.     Comments: Does have reproducible tenderness to left lower chest wall and left lateral lower ribs. Chest:     Chest wall: Tenderness present.  Abdominal:     General: Abdomen is flat. Bowel sounds are normal.     Palpations: Abdomen is soft.     Tenderness: There is no abdominal tenderness.  Skin:    General: Skin is warm and dry.     Findings: No lesion.  Neurological:     General: No focal deficit present.     Mental Status: She is alert and oriented to person, place, and time. Mental status is at baseline.  ED Results / Procedures / Treatments   Labs (all labs ordered are listed, but only abnormal results are displayed) Labs Reviewed  BASIC METABOLIC PANEL WITH GFR  CBC  HCG, QUANTITATIVE, PREGNANCY  D-DIMER, QUANTITATIVE  TROPONIN I (HIGH SENSITIVITY)  TROPONIN I (HIGH SENSITIVITY)    EKG None  Radiology DG Chest 2 View Result Date: 06/02/2023 CLINICAL DATA:  chest pain EXAM: CHEST - 2 VIEW COMPARISON:  04/21/2008 FINDINGS: Lungs are clear.  No pneumothorax. Heart size and mediastinal contours are within normal limits. Aortic Atherosclerosis (ICD10-170.0). No effusion. Visualized bones unremarkable. IMPRESSION: No acute cardiopulmonary disease. Electronically Signed   By: Nicoletta Barrier M.D.   On: 06/02/2023 13:46    Procedures Procedures    Medications Ordered in ED Medications - No data to display  ED Course/  Medical Decision Making/ A&P Clinical Course as of 06/02/23 1508  Sun Jun 02, 2023  1500 S- sudden onset CP at church, left lower chest, worse with inspiration and reproducible, bilat calf swelling [ ]  follow-up second trop and ddimer Can dc if negative [AO]    Clinical Course User Index [AO] Lorain Robson, MD                                 Medical Decision Making Amount and/or Complexity of Data Reviewed Labs: ordered. Radiology: ordered.  Risk Prescription drug management.   This patient presents to the ED for concern of CP, this involves an extensive number of treatment options, and is a complaint that carries with it a high risk of complications and morbidity.  The differential diagnosis includes CHF, PE, ACS, dissection, PNA, pericarditis, esophagitis, muscle spasm   Co morbidities that complicate the patient evaluation  Eczema    Additional history obtained:  Additional history obtained from 02/16/23 OV   Lab Tests:  I personally interpreted labs.  The pertinent results include:   CBC with leukocytosis.  No anemia.  No significant electrolyte abnormality.  Initial troponin 4.  Delta troponin pending, D-dimer pending.   Imaging Studies ordered:  I ordered imaging studies including chest x-ray  I independently visualized and interpreted imaging which showed no acute findings I agree with the radiologist interpretation   Cardiac Monitoring: / EKG:  The patient was maintained on a cardiac monitor.  I personally viewed and interpreted the cardiac monitored which showed an underlying rhythm of: sinus    Problem List / ED Course / Critical interventions / Medication management  Patient reporting to emergency room with complaint of chest pain.  Patient reports this started acutely today approximately 2 hours ago.  Patient hemodynamically stable and well-appearing.  Lungs clear to auscultation bilaterally.  Denies injury trauma or fall.  No cardiac history in the  past.  She has strong radial pulses equal bilaterally.  Patient reports that pain has slightly eased up since arriving to emergency room.  Pain is worse when taking deep breath then and reproducible on exam.  Chest x-ray without any acute abnormality thus doubt pneumonia or pneumothorax as cause of chest pain.  Will obtain D-dimer to rule out PE.  Initial troponin and EKG reassuring thus doubt ACS.  Symptoms are not consistent with dissection.  Given the patient's symptoms are reproducible on exam and worse with taking deep breath then we will trial Lidoderm and flexeril and reassess. I ordered medication including flexeril, lidoderm  I have reviewed the patients home medicines and have made adjustments as needed  Plan  Signed off to oncoming ED provider pending dimer, repeat trop and reassessment.           Final Clinical Impression(s) / ED Diagnoses Final diagnoses:  None    Rx / DC Orders ED Discharge Orders     None         Eudora Heron, PA-C 06/02/23 1508    Auston Blush, MD 06/06/23 1630

## 2024-02-11 ENCOUNTER — Other Ambulatory Visit: Payer: Self-pay | Admitting: Radiology

## 2024-02-12 LAB — SURGICAL PATHOLOGY
